# Patient Record
Sex: Female | Born: 1985 | Race: Asian | Hispanic: No | Marital: Married | Smoking: Never smoker
Health system: Southern US, Community
[De-identification: ages and names within clinical notes are randomized; demographics above are authoritative.]

## PROBLEM LIST (undated history)

## (undated) ENCOUNTER — Ambulatory Visit
Discharge status: Absent without leave | Payer: No Typology Code available for payment source | Attending: Emergency Medicine | Admitting: Emergency Medicine

---
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
## 2009-04-18 ENCOUNTER — Emergency Department (HOSPITAL_BASED_OUTPATIENT_CLINIC_OR_DEPARTMENT_OTHER): Admission: EM | Admit: 2009-04-18 | Discharge: 2009-04-19 | Payer: Self-pay | Admitting: Emergency Medicine

## 2009-05-18 ENCOUNTER — Ambulatory Visit (HOSPITAL_COMMUNITY): Admission: RE | Admit: 2009-05-18 | Discharge: 2009-05-18 | Payer: Self-pay | Admitting: Obstetrics and Gynecology

## 2009-10-10 ENCOUNTER — Inpatient Hospital Stay (HOSPITAL_COMMUNITY): Admission: AD | Admit: 2009-10-10 | Discharge: 2009-10-11 | Payer: Self-pay | Admitting: Obstetrics and Gynecology

## 2010-11-24 LAB — CBC
HCT: 35 % — ABNORMAL LOW (ref 36.0–46.0)
Hemoglobin: 8.5 g/dL — ABNORMAL LOW (ref 12.0–15.0)
MCHC: 33 g/dL (ref 30.0–36.0)
MCV: 81.2 fL (ref 78.0–100.0)
Platelets: 255 10*3/uL (ref 150–400)
RDW: 13.6 % (ref 11.5–15.5)
RDW: 13.8 % (ref 11.5–15.5)
WBC: 18.5 10*3/uL — ABNORMAL HIGH (ref 4.0–10.5)

## 2010-11-24 LAB — RPR: RPR Ser Ql: NONREACTIVE

## 2011-01-21 NOTE — Discharge Summary (Signed)
NAMERAINNA, NEARHOOD                              ACCOUNT NO.:  [id number]   MEDICAL RECORD NO.:  [id number]                   PATIENT TYPE:  INP   LOCATION:  0100                                 FACILITY:  [facility]   PHYSICIAN:  Cindie Crumbly, M.D.               DATE OF BIRTH:  [birth year]   DATE OF ADMISSION:  09/12/2002  DATE OF DISCHARGE:                                 DISCHARGE SUMMARY   REASON FOR ADMISSION:  This 25 year old Asian female was admitted  complaining of depression status post overdose as a suicide attempt.  For  further history of present illness, please see the patient's psychiatry  admission assessment.   PHYSICAL EXAMINATION:  At the time of admission was entirely unremarkable.   LABORATORY EXAMINATION:  The patient underwent a laboratory workup to rule  out any medical problems contributing to her symptomatology.  Free T4 and  TSH were within normal limits.  A urine probe for gonorrhea and chlamydia  were negative.  GGT was within normal limits.  All further laboratory was  performed at [facility] prior to the patient being transferred to  this facility for more definitive stabilization.   HOSPITAL COURSE:  On admission, the patient was psychomotor agitated,  superficial and guarded.  Her affect and mood were depressed and irritable.  She displayed poor impulse control and decreased concentration.  She refused  a trial an antidepressant medication but has been active in participating in  psychotherapy.  At the time of discharge, she denies any homicidal or  suicidal ideation.  Her affect and mood have improved.  She no longer  appears to be a danger to herself or others, and consequently is felt to  have reached her maximum benefits of hospitalization and is ready for  discharge to a less restricted alternative setting.   CONDITION ON DISCHARGE:  Improved.   DISCHARGE DIAGNOSES:   AXIS I:  1. Major depression, single episode, severe, without  psychosis.  2. Rule out adjustment disorder with mixed disturbance of conduct and     emotions.  3. Rule out conduct disorder.   AXIS II:  1. Rule out personality disorder not otherwise specified.  2. Rule out learning disorder not otherwise specified.   AXIS III:  None.   AXIS IV:  Severe   AXIS V:  Code 20 on admission, code 30 on discharge.   FURTHER EVALUATION AND TREATMENT RECOMMENDATIONS:  1. The patient is discharged to home.  2. She is discharged on an unrestricted level of activity and a regular     diet.  3. She will follow up with [facility] for all     further aspects of her psychiatric care and consequently I will sign off     on the case at this time.  She will follow up with her primary care     physician for any  medical problems.                                               Cindie Crumbly, M.D.    TS/MEDQ  D:  09/17/2002  T:  09/17/2002  Job:  [id number]

## 2011-01-21 NOTE — H&P (Signed)
[name], STAY                  ACCOUNT NO.:  [id number]   MEDICAL RECORD NO.:  [id number]          PATIENT TYPE:  INP   LOCATION:  9174                          [facility]ACILITY:  [facility]   PHYSICIAN:  Naima A. Dillard, M.D. DATE OF BIRTH:  [birth year]   DATE OF ADMISSION:  11/27/2004  DATE OF DISCHARGE:                                HISTORY & PHYSICAL   [name] is an 25 year old gravida 1, para 0, at 25 weeks by 14-6/7th weeks  ultrasound, who presented complaining of cramping tonight also reports  increased discharge over the last few days.  She denies any bleeding or  leaking and reports positive fetal movement.  She denies any recent  intercourse, trauma, or any other issues.  She denies any recent viral  syndrome, fever, or any other infectious process.   Pregnancy has been remarkable for:  1.  History of irregular cycles.  2.  Smoker.  3.  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      She had a normal Pap in November 2005.  4.  History of overdose at age 6, received counseling.  5.  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   PRENATAL LABS:  Blood type is AB positive.  Rh antibody negative.  VDRL  nonreactive.  Rubella titer positive.  Hepatitis B surface antigen negative.  Hemoglobin electrophoresis showed hemoglobin AE.  Hemoglobin upon entering  the practice was 10.3.  Pap was normal.  GC and Chlamydia cultures were  negative in the first trimester.  Cystic fibrosis testing was negative.  Quadruple screen was normal.  EDC of March 11, 2005 was established by 14-  6/7th weeks ultrasound.   HISTORY OF PRESENT PREGNANCY:  The patient entered care at approximately 12  weeks.  She had a history of irregular cycles.  She declined HIV.  Pap  records were reviewed and in December 2004, she had a low grade SIL at  [facility].  She was referred to Dr. Henderson Cloud.  She had a colposcopy with a  negative biopsy.  Pap smears were recommended every four months but  the  patient did not followup from that point.  She had another ultrasound at 18  weeks by dates but was found to be 14-6/7th weeks by ultrasound.  Quadruple  screen was done with a corrected EDC which was normal.  She had another  ultrasound at 18 to 19 weeks showing normal growth and development with an  anterior placenta.  Cervix was 4.1-cm and normal anatomy was noted.   OBSTETRICAL HISTORY:  The patient is a primigravida.   MEDICAL HISTORY:  She was on Ortho Evra patch in the past.  In December  2004, she had an abnormal Pap but then a normal biopsy.  She reports the  usual childhood illnesses.  She was hospitalized from an overdose at age 33  but denies any depression at this time.  She did have some fainting spells  this summer.   She has no known medication allergies.   FAMILY HISTORY:  Her sister had a brain tumor,  is deceased at age 32.  Her  father drinks alcohol.   GENETIC HISTORY:  Remarkable for the father, maternal grandmother, and  father's cousin being twins.  Maternal grandfather had lung cancer.  Maternal grandmother also had cancer.  Father of the baby has ADHD.   SOCIAL HISTORY:  The patient is single.  The father of the baby is involved  and supportive.  His name is Holley Bouche.  The patient is also  accompanied tonight by her boyfriend's sister, Randel Pigg.  The patient  is high school educated.  She is employed as a Actuary at [employer].  The patient's partner has a high school education.  He is  employed as an Personnel officer.  She has been followed by [facility] at [facility].  She denies any alcohol or drug use  during this pregnancy.  She has been approximately a five cigarettes per day  smoker.   PHYSICAL EXAMINATION:  VITAL SIGNS:  Stable.  The patient is afebrile.  HEENT:  Within normal limits.  LUNGS:  Breath sounds are clear.  HEART:  Regular rate and rhythm without murmur.  BREASTS:  Soft and  nontender.  ABDOMEN:  Fundal height is approximately 25-cm.  Estimated fetal weight 1 to  2 pounds.  Uterine contractions initially were every 3-4 minutes, mild to  moderate in quality, although the patient was not uncomfortable.  Fetal  heart rate was reassuring with an occasional mild variable.  PELVIC:  Speculum exam showed the cervix appearing to be about 4-cm dilated,  membranes bulging into the vault.  There was a thin vaginal discharge noted.  EXTREMITIES:  Deep tendon reflexes were 2+ without clonus.  There is a trace  edema noted.   Crist Fat was negative.  GC Chlamydia and Group B strep were done.  Wet prep was  negative.  Clean catch urine was negative.  CBC showed a hemoglobin of 10.4,  hematocrit of 31.8, white blood cell count of 18.6, and platelet count of  321.  Neutrophil percentage on differential was 75%.  Comprehensive  metabolic panel was normal.  LDH and uric acid were also normal.  Wet prep  was negative.   IMPRESSION:  1.  Intrauterine pregnancy at 25 weeks.  2.  Preterm labor.  3.  Elevated white blood cell count.   PLAN:  1.  Admit to birthing suite for consult with Dr. Pierre Bali. Dillard as      attending physician.  2.  Magnesium sulfate therapy with 4 gram bolus then 2 grams per hour.  3.  Betamethasone course 12.5 mg q.12h. x 2 doses.  4.  Penicillin for Group B strep prophylaxis, per standard dosing.  5.  Bedside ultrasound for position and cervical status.  6.  Review the status of the patient's situation with the patient including      risk of premature delivery, uncertainty of fetal status, and plan of      care regarding above interventions.  The patient seems to understand the      plan of care and wishes to proceed with it.  7.  Plan recheck CBC with differential in the morning.  8.  NICU consult and social work consult will be done.     VLL/MEDQ  D:  11/27/2004  T:  11/27/2004  Job:  [id number]

## 2011-01-21 NOTE — H&P (Signed)
NAMEALBERTINA, Wendy Oconnell                              ACCOUNT NO.:  [id number]   MEDICAL RECORD NO.:  [id number]                   PATIENT TYPE:  INP   LOCATION:  0100                                 [facility]ACILITY:  [facility]   PHYSICIAN:  Cindie Crumbly, M.D.               DATE OF BIRTH:  1985-11-28   DATE OF ADMISSION:  09/12/2002  DATE OF DISCHARGE:                         PSYCHIATRIC ADMISSION ASSESSMENT   REASON FOR ADMISSION:  This 25 year old Asian female was admitted  complaining of depression status post overdose on Ephedra as a suicide  attempt.   HISTORY OF PRESENT ILLNESS:  The patient left a suicide note.  She complains  of an increasingly depressed, irritable and angry mood most of the day  nearly every day, anhedonia, giving up on activities previously enjoyed,  decreased school performance, feelings of hopelessness, helplessness,  worthlessness, decreased concentration and energy level, increased symptoms  of fatigue, psychomotor agitated, insomnia.  She denies any change in  appetite or weight.  She admits to recurrent thoughts of death, is unable to  contract for safety.   PAST PSYCHIATRIC HISTORY:  Significant for 1 previous suicide attempt with  an ibuprofen overdose 1 year ago when she broke up with her boyfriend.  She  has no previous history of inpatient or outpatient treatment.  She reports  that she did not tell anyone the last time she overdosed.  She has a history  of conduct disorder including stealing, shoplifting, and running away.   DRUG AND ALCOHOL ABUSE HISTORY:  The patient smokes 2-3 cigarettes per day.  She denies any use of alcohol or street drugs or abuse of prescription  drugs.   PAST MEDICAL HISTORY:  The patient denies any medical or surgical problems.  She has no known drug allergies or sensitivities.   CURRENT MEDICATIONS:  She is on no current medications.   STRENGTHS AND ASSETS:  Her family is very supportive of her.   FAMILY AND SOCIAL HISTORY:   The patient lives with her mother, father and  sister.  Mother speaks [place] and does not speak [place].  The patient  does not speak [place].  Father is bilingual.  Maricela Curet is also bilingual.  The patient is currently in the 11th grade.  She works at [employer]  part time.  The patient denies any other family history of psychiatric or  neurologic illness.   MENTAL STATUS EXAM:  The patient presents as a well-developed, well-  nourished Asian female, who is alert, oriented x4, psychomotor agitated, and  whose appearance is compatible with her stated age.  Her speech is coherent  with a normal rate and volume of speech. She displays no looseness of  associations, phonemic errors or evidence of a thought disorder.  She is  superficial, guarded, gamy and manipulative.  Her affect and mood are  depressed and irritable.  Her concentration is decreased.  She displays poor  impulse control.  Her immediate recall, short term memory and remote memory  are intact.  Similarities and differences are within normal limits.  She is  able to abstract the proverbs.  Her thought processes are goal directed.   ADMISSION DIAGNOSES:   AXIS I:  1. Major depression, single episode, severe, without psychosis.  2. Rule out adjustment disorder with mixed disturbance of conduct and     emotions.   AXIS II:  1. Rule out personality disorder not otherwise specified.  2. Rule out learning disorder not otherwise specified.   AXIS III:  None.   AXIS IV:  Severe.   AXIS V:  Code 20.   FURTHER EVALUATION AND TREATMENT RECOMMENDATIONS:  1. Estimated length of stay for the patient on the inpatient unit is 5 to 7     days.  2. Initial discharge plan is to discharge the patient to home.  3. Initial plan of care is begin the patient on a trial of antidepressant     medication once informed consent is obtained and a risks/benefits     discussion has been held.  The patient however at the present time  is     refusing all trials of antidepressant medication.  Psychotherapy will     focus on improving the patient's impulse control, decreasing cognitive     distortions and potential for self harm.  A laboratory workup will also     be initiated to rule out any other medical problems contributing to her     symptomatology.                                                 Cindie Crumbly, M.D.    TS/MEDQ  D:  09/13/2002  T:  09/13/2002  Job:  [id number]

## 2011-01-21 NOTE — Discharge Summary (Signed)
NAMELASYA, Wendy Oconnell                  ACCOUNT NO.:  [id number]   MEDICAL RECORD NO.:  [id number]          PATIENT TYPE:  INP   LOCATION:  9304                          [facility]ACILITY:  [facility]   PHYSICIAN:  Janine Limbo, M.D.DATE OF BIRTH:  [birth year]   DATE OF ADMISSION:  11/27/2004  DATE OF DISCHARGE:  12/03/2004                                 DISCHARGE SUMMARY   ADMISSION DIAGNOSES:  1.  Intrauterine pregnancy at 25 weeks.  2.  Preterm labor.  3.  Elevated white blood cell count.  4.  Group B Streptococcus positive.   DISCHARGE DIAGNOSES:  1.  Intrauterine pregnancy at 25 weeks.  2.  Preterm labor.  3.  Elevated white blood cell count.  4.  Anemia.  5.  Preterm spontaneous vaginal delivery of a viable female infant weighing      1 pound 10 ounces, Apgars 6 and 8.  Cord pH 7.42.  6.  Group B Streptococcus positive.   HOSPITAL PROCEDURES:  1.  Electronic fetal monitoring.  2.  Ultrasound.  3.  Magnesium sulfate tocolysis.  4.  Spontaneous vaginal delivery.   HOSPITAL COURSE:  The patient was admitted at 25 weeks with preterm labor.  Upon admission, her cervix was 4 cm dilated, with membranes bulging into  vaginal vault.  The patient was placed on bed rest and magnesium sulfate for  tocolysis.  She was given two doses of betamethasone and given penicillin  for group B Streptococcus prophylaxis.  NICU consult was made to discuss  with the patient expectations for preterm delivery.  The patient was on bed  rest for several days.  Had some increased bleeding on November 28, 2004, but  the baby remained stable.  Her magnesium sulfate was maintained.  Her  hemoglobin dropped to 7.7 on November 29, 2004, and her white count improved  from 24.1 to 16.1.  She had an episode of bleeding on November 30, 2004 and  progressed to 4-5 cm dilated.  She was then transferred to labor and  delivery for closer monitoring.  Her white blood cell count decreased to  12.3 on December 01, 2004.  In the morning of  December 02, 2004, the patient  experienced increased pressure and bleeding and was found to be completely  dilated with the vertex presenting at +2 station.  NICU team was called.  The patient was delivered vaginally after amniotomy for clear fluid.  Female  infant was delivered with spontaneous cry.  Apgars were 6 and 8, and she had  a cord pH of 7.42, and she was taken to the NICU for care.  On postpartum  day one, the patient was doing well and requesting to go home.  Her vital  signs were stable.  She was afebrile.  Chest was clear.  Heart rate:  Regular rate and rhythm.  Abdomen was soft.  Fundus was firm.  Lochia was  small.  Hemoglobin was 8.9, and extremities were within normal limits.  She  is deemed to have received the full benefit of her hospital stay and was  discharged home.  DISCHARGE MEDICATIONS:  1.  Motrin 600 mg p.o. q.6 h. p.r.n.  2.  Micronor one p.o. daily.  3.  Vicodin 1-2 p.o. q.4 h. p.r.n. pain.  4.  Iron supplement 325 mg t.i.d.   DISCHARGE LABORATORIES:  White blood cell count 15, hemoglobin 8.9, platelet  count 325.   DISCHARGE INSTRUCTIONS:  Per [facility] handout.   DISCHARGE FOLLOWUP:  In six weeks or p.r.n.      MLW/MEDQ  D:  12/03/2004  T:  12/03/2004  Job:  [id number]

## 2011-09-06 HISTORY — PX: ECTOPIC PREGNANCY SURGERY: SHX613

## 2012-09-05 NOTE — L&D Delivery Note (Signed)
Patient was C/C/+3 and pushed for 5 minutes with epidural.   NSVD  female infant, Apgars 8,9, weight P.   The patient had one second degree midline lacerations repaired with 2-0 vicryl R. Fundus was firm. EBL was expected. Placenta was delivered intact. Vagina was clear.  Baby was vigorous and doing skin to skin with mother.  Jeraldine Primeau A

## 2012-12-18 ENCOUNTER — Other Ambulatory Visit: Payer: Self-pay | Admitting: Obstetrics and Gynecology

## 2012-12-20 LAB — OB RESULTS CONSOLE ANTIBODY SCREEN: Antibody Screen: NEGATIVE

## 2012-12-20 LAB — OB RESULTS CONSOLE ABO/RH

## 2013-01-18 LAB — OB RESULTS CONSOLE RPR
RPR: NONREACTIVE
RPR: NONREACTIVE

## 2013-01-18 LAB — OB RESULTS CONSOLE GC/CHLAMYDIA: Gonorrhea: NEGATIVE

## 2013-01-18 LAB — OB RESULTS CONSOLE HEPATITIS B SURFACE ANTIGEN: Hepatitis B Surface Ag: NEGATIVE

## 2013-01-18 LAB — OB RESULTS CONSOLE RUBELLA ANTIBODY, IGM: Rubella: IMMUNE

## 2013-07-02 LAB — OB RESULTS CONSOLE GBS: GBS: NEGATIVE

## 2013-07-21 ENCOUNTER — Inpatient Hospital Stay (HOSPITAL_COMMUNITY): Payer: 59 | Admitting: Anesthesiology

## 2013-07-21 ENCOUNTER — Encounter (HOSPITAL_COMMUNITY): Payer: Self-pay | Admitting: *Deleted

## 2013-07-21 ENCOUNTER — Inpatient Hospital Stay (HOSPITAL_COMMUNITY)
Admission: AD | Admit: 2013-07-21 | Discharge: 2013-07-23 | DRG: 775 | Disposition: A | Discharge status: Home / Self Care | Payer: 59 | Source: Ambulatory Visit | Attending: Obstetrics and Gynecology | Admitting: Obstetrics and Gynecology

## 2013-07-21 ENCOUNTER — Encounter (HOSPITAL_COMMUNITY): Payer: 59 | Admitting: Anesthesiology

## 2013-07-21 LAB — CBC
MCH: 24.9 pg — ABNORMAL LOW (ref 26.0–34.0)
MCHC: 33.9 g/dL (ref 30.0–36.0)
Platelets: 243 10*3/uL (ref 150–400)
RDW: 13.1 % (ref 11.5–15.5)

## 2013-07-21 LAB — ABO/RH: ABO/RH(D): AB POS

## 2013-07-21 LAB — RPR: RPR Ser Ql: NONREACTIVE

## 2013-07-21 LAB — POCT FERN TEST: POCT Fern Test: POSITIVE

## 2013-07-21 LAB — TYPE AND SCREEN
ABO/RH(D): AB POS
Antibody Screen: NEGATIVE

## 2013-07-21 MED ORDER — ONDANSETRON HCL 4 MG PO TABS: 4.0000 mg | ORAL_TABLET | ORAL | Status: DC | PRN

## 2013-07-21 MED ORDER — MAGNESIUM HYDROXIDE 400 MG/5ML PO SUSP: 30.0000 mL | ORAL | Status: DC | PRN

## 2013-07-21 MED ORDER — IBUPROFEN 600 MG PO TABS: 600.0000 mg | ORAL_TABLET | Freq: Four times a day (QID) | ORAL | Status: DC | PRN

## 2013-07-21 MED ORDER — METHYLERGONOVINE MALEATE 0.2 MG/ML IJ SOLN: 0.2000 mg | INTRAMUSCULAR | Status: DC | PRN

## 2013-07-21 MED ORDER — ONDANSETRON HCL 4 MG/2ML IJ SOLN: 4.0000 mg | Freq: Four times a day (QID) | INTRAMUSCULAR | Status: DC | PRN

## 2013-07-21 MED ORDER — PHENYLEPHRINE 40 MCG/ML (10ML) SYRINGE FOR IV PUSH (FOR BLOOD PRESSURE SUPPORT)
80.0000 ug | PREFILLED_SYRINGE | INTRAVENOUS | Status: DC | PRN
  Filled 2013-07-21: qty 2

## 2013-07-21 MED ORDER — METHYLERGONOVINE MALEATE 0.2 MG PO TABS: 0.2000 mg | ORAL_TABLET | ORAL | Status: DC | PRN

## 2013-07-21 MED ORDER — FLEET ENEMA 7-19 GM/118ML RE ENEM: 1.0000 | ENEMA | RECTAL | Status: DC | PRN

## 2013-07-21 MED ORDER — EPHEDRINE 5 MG/ML INJ
10.0000 mg | INTRAVENOUS | Status: DC | PRN
  Filled 2013-07-21: qty 2
  Filled 2013-07-21: qty 4

## 2013-07-21 MED ORDER — FERROUS SULFATE 325 (65 FE) MG PO TABS
325.0000 mg | ORAL_TABLET | Freq: Two times a day (BID) | ORAL | Status: DC
  Administered 2013-07-22 (×2): 325 mg via ORAL
  Filled 2013-07-21 (×2): qty 1

## 2013-07-21 MED ORDER — PRENATAL MULTIVITAMIN CH
1.0000 | ORAL_TABLET | Freq: Every day | ORAL | Status: DC
  Administered 2013-07-22: 1 via ORAL
  Filled 2013-07-21: qty 1

## 2013-07-21 MED ORDER — OXYTOCIN BOLUS FROM INFUSION
500.0000 mL | INTRAVENOUS | Status: DC
  Administered 2013-07-21: 500 mL via INTRAVENOUS

## 2013-07-21 MED ORDER — EPHEDRINE 5 MG/ML INJ
10.0000 mg | INTRAVENOUS | Status: DC | PRN
  Filled 2013-07-21: qty 2

## 2013-07-21 MED ORDER — LIDOCAINE HCL (PF) 1 % IJ SOLN
INTRAMUSCULAR | Status: DC | PRN
  Administered 2013-07-21 (×2): 5 mL
  Administered 2013-07-21: 3 mL

## 2013-07-21 MED ORDER — LANOLIN HYDROUS EX OINT: TOPICAL_OINTMENT | CUTANEOUS | Status: DC | PRN

## 2013-07-21 MED ORDER — OXYCODONE-ACETAMINOPHEN 5-325 MG PO TABS
1.0000 | ORAL_TABLET | ORAL | Status: DC | PRN
  Administered 2013-07-23: 1 via ORAL
  Filled 2013-07-21: qty 1

## 2013-07-21 MED ORDER — DIPHENHYDRAMINE HCL 50 MG/ML IJ SOLN: 12.5000 mg | INTRAMUSCULAR | Status: DC | PRN

## 2013-07-21 MED ORDER — SODIUM CHLORIDE 0.9 % IJ SOLN: 3.0000 mL | Freq: Two times a day (BID) | INTRAMUSCULAR | Status: DC

## 2013-07-21 MED ORDER — SODIUM CHLORIDE 0.9 % IJ SOLN: 3.0000 mL | INTRAMUSCULAR | Status: DC | PRN

## 2013-07-21 MED ORDER — LACTATED RINGERS IV SOLN: INTRAVENOUS | Status: DC

## 2013-07-21 MED ORDER — SIMETHICONE 80 MG PO CHEW: 80.0000 mg | CHEWABLE_TABLET | ORAL | Status: DC | PRN

## 2013-07-21 MED ORDER — MEASLES, MUMPS & RUBELLA VAC ~~LOC~~ INJ
0.5000 mL | INJECTION | Freq: Once | SUBCUTANEOUS | Status: DC
  Filled 2013-07-21: qty 0.5

## 2013-07-21 MED ORDER — FENTANYL 2.5 MCG/ML BUPIVACAINE 1/10 % EPIDURAL INFUSION (WH - ANES)
14.0000 mL/h | INTRAMUSCULAR | Status: DC | PRN
  Filled 2013-07-21: qty 125

## 2013-07-21 MED ORDER — ONDANSETRON HCL 4 MG/2ML IJ SOLN: 4.0000 mg | INTRAMUSCULAR | Status: DC | PRN

## 2013-07-21 MED ORDER — FENTANYL 2.5 MCG/ML BUPIVACAINE 1/10 % EPIDURAL INFUSION (WH - ANES)
INTRAMUSCULAR | Status: DC | PRN
  Administered 2013-07-21: 14 mL/h via EPIDURAL

## 2013-07-21 MED ORDER — ACETAMINOPHEN 325 MG PO TABS: 650.0000 mg | ORAL_TABLET | ORAL | Status: DC | PRN

## 2013-07-21 MED ORDER — LIDOCAINE HCL (PF) 1 % IJ SOLN
30.0000 mL | INTRAMUSCULAR | Status: DC | PRN
  Filled 2013-07-21 (×2): qty 30

## 2013-07-21 MED ORDER — WITCH HAZEL-GLYCERIN EX PADS
1.0000 "application " | MEDICATED_PAD | CUTANEOUS | Status: DC | PRN
  Administered 2013-07-21: 1 via TOPICAL

## 2013-07-21 MED ORDER — DIBUCAINE 1 % RE OINT: 1.0000 "application " | TOPICAL_OINTMENT | RECTAL | Status: DC | PRN

## 2013-07-21 MED ORDER — SENNOSIDES-DOCUSATE SODIUM 8.6-50 MG PO TABS
2.0000 | ORAL_TABLET | ORAL | Status: DC
  Administered 2013-07-21 – 2013-07-22 (×2): 2 via ORAL
  Filled 2013-07-21 (×2): qty 2

## 2013-07-21 MED ORDER — LACTATED RINGERS IV SOLN: 500.0000 mL | INTRAVENOUS | Status: DC | PRN

## 2013-07-21 MED ORDER — OXYCODONE-ACETAMINOPHEN 5-325 MG PO TABS: 1.0000 | ORAL_TABLET | ORAL | Status: DC | PRN

## 2013-07-21 MED ORDER — ZOLPIDEM TARTRATE 5 MG PO TABS: 5.0000 mg | ORAL_TABLET | Freq: Every evening | ORAL | Status: DC | PRN

## 2013-07-21 MED ORDER — SODIUM CHLORIDE 0.9 % IV SOLN: 250.0000 mL | INTRAVENOUS | Status: DC | PRN

## 2013-07-21 MED ORDER — DIPHENHYDRAMINE HCL 25 MG PO CAPS: 25.0000 mg | ORAL_CAPSULE | Freq: Four times a day (QID) | ORAL | Status: DC | PRN

## 2013-07-21 MED ORDER — BENZOCAINE-MENTHOL 20-0.5 % EX AERO
1.0000 "application " | INHALATION_SPRAY | CUTANEOUS | Status: DC | PRN
  Administered 2013-07-21: 1 via TOPICAL
  Filled 2013-07-21: qty 56

## 2013-07-21 MED ORDER — LACTATED RINGERS IV SOLN: 500.0000 mL | Freq: Once | INTRAVENOUS | Status: DC

## 2013-07-21 MED ORDER — CITRIC ACID-SODIUM CITRATE 334-500 MG/5ML PO SOLN: 30.0000 mL | ORAL | Status: DC | PRN

## 2013-07-21 MED ORDER — IBUPROFEN 800 MG PO TABS
800.0000 mg | ORAL_TABLET | Freq: Three times a day (TID) | ORAL | Status: DC
  Administered 2013-07-21 – 2013-07-22 (×4): 800 mg via ORAL
  Filled 2013-07-21 (×4): qty 1

## 2013-07-21 MED ORDER — OXYTOCIN 40 UNITS IN LACTATED RINGERS INFUSION - SIMPLE MED
62.5000 mL/h | INTRAVENOUS | Status: DC
  Filled 2013-07-21: qty 1000

## 2013-07-21 MED ORDER — TETANUS-DIPHTH-ACELL PERTUSSIS 5-2.5-18.5 LF-MCG/0.5 IM SUSP
0.5000 mL | Freq: Once | INTRAMUSCULAR | Status: AC
  Administered 2013-07-22: 0.5 mL via INTRAMUSCULAR
  Filled 2013-07-21: qty 0.5

## 2013-07-21 MED ORDER — PHENYLEPHRINE 40 MCG/ML (10ML) SYRINGE FOR IV PUSH (FOR BLOOD PRESSURE SUPPORT)
80.0000 ug | PREFILLED_SYRINGE | INTRAVENOUS | Status: DC | PRN
  Filled 2013-07-21: qty 2
  Filled 2013-07-21 (×2): qty 10

## 2013-07-21 NOTE — H&P (Signed)
27 y.o. [redacted]w[redacted]d  [id number] comes in c/o SROM at 14:00 today.  Otherwise has good fetal movement and no bleeding.  History reviewed. No pertinent past medical history.  Past Surgical History  Procedure Laterality Date  . Ectopic pregnancy surgery Left 2013    OB History  Gravida Para Term Preterm AB SAB TAB Ectopic Multiple Living  4 2 1 1 1   1  2     # Outcome Date GA Lbr Len/2nd Weight Sex Delivery Anes PTL Lv  4 CUR           3 ECT 03/20/12          2 TRM 10/10/09    F SVD   Y  1 PRE 12/02/04    F SVD  Y Y      History   Social History  . Marital Status: Married    Spouse Name: N/A    Number of Children: N/A  . Years of Education: N/A   Occupational History  . Not on file.   Social History Main Topics  . Smoking status: Never Smoker   . Smokeless tobacco: Never Used  . Alcohol Use: No  . Drug Use: No  . Sexual Activity: Yes    Birth Control/ Protection: None   Other Topics Concern  . Not on file   Social History Narrative  . No narrative on file   Review of patient's allergies indicates no known allergies.    Prenatal Transfer Tool  Maternal Diabetes: No- passed 3 hour gtt Genetic Screening: Normal Maternal Ultrasounds/Referrals: Normal Fetal Ultrasounds or other Referrals:  None Maternal Substance Abuse:  No Significant Maternal Medications:  Meds include: Progesterone Significant Maternal Lab Results: None  Other PNC: uncomplicated.    Filed Vitals:   07/21/13 1614  BP: 120/64  Pulse: 77  Temp: 98.1 F (36.7 C)  Resp: 18     Lungs/Cor:  NAD Abdomen:  soft, gravid Ex:  no cords, erythema SVE:  4/C/-2, clear fluid FHTs:  120s, good STV, NST R Toco:  q3-8   A/P   Term ROM- pitocin for augmentation if no change in 2 hours.  GBS neg.  Tommi Crepeau A

## 2013-07-21 NOTE — Progress Notes (Signed)
Dr Henderson Cloud notified of pt's ROM, VE, orders received to admit pt.

## 2013-07-21 NOTE — Anesthesia Preprocedure Evaluation (Signed)

## 2013-07-21 NOTE — MAU Note (Signed)
Pt presents with complaints of  A large gush of fluid around 2o'clock today. Pt denies any vaginal bleeding or contractions

## 2013-07-21 NOTE — Anesthesia Procedure Notes (Signed)
Epidural Patient location during procedure: OB  Staffing Anesthesiologist: Lockie Bothun Performed by: anesthesiologist   Preanesthetic Checklist Completed: patient identified, site marked, surgical consent, pre-op evaluation, timeout performed, IV checked, risks and benefits discussed and monitors and equipment checked  Epidural Patient position: sitting Prep: ChloraPrep Patient monitoring: heart rate, continuous pulse ox and blood pressure Approach: right paramedian Injection technique: LOR saline  Needle:  Needle type: Tuohy  Needle gauge: 17 G Needle length: 9 cm and 9 Needle insertion depth: 5 cm Catheter type: closed end flexible Catheter size: 20 Guage Catheter at skin depth: 10 cm Test dose: negative  Assessment Events: blood not aspirated, injection not painful, no injection resistance, negative IV test and no paresthesia  Additional Notes   Patient tolerated the insertion well without complications.   

## 2013-07-22 LAB — CBC
HCT: 27 % — ABNORMAL LOW (ref 36.0–46.0)
Hemoglobin: 9.2 g/dL — ABNORMAL LOW (ref 12.0–15.0)
MCHC: 34.1 g/dL (ref 30.0–36.0)
MCV: 74 fL — ABNORMAL LOW (ref 78.0–100.0)
RBC: 3.65 MIL/uL — ABNORMAL LOW (ref 3.87–5.11)
RDW: 13.1 % (ref 11.5–15.5)
WBC: 16.2 10*3/uL — ABNORMAL HIGH (ref 4.0–10.5)

## 2013-07-22 NOTE — Progress Notes (Signed)
Post Partum Day 1 Subjective: no complaints  Objective: Blood pressure 99/63, pulse 83, temperature 97.9 F (36.6 C), temperature source Oral, resp. rate 20, height 5\' 2"  (1.575 m), weight 64.411 kg (142 lb), last menstrual period 10/25/2012, SpO2 100.00%,  breastfeeding.  Physical Exam:  General: alert Lochia: appropriate Uterine Fundus: firm   Recent Labs  07/21/13 1550 07/22/13 0555  HGB 10.2* 9.2*  HCT 30.1* 27.0*    Assessment/Plan: Plan for discharge tomorrow.  Baby not ready for circ this morning because he has not voided yet.  I will try to perform later today.   LOS: 1 day   Fountain Derusha D 07/22/2013, 10:30 AM

## 2013-07-22 NOTE — Lactation Note (Signed)
This note was copied from the chart of Wendy Oconnell. Lactation Consultation Note Initial consultation, mom's 3rd child, mom attempted to breast feed the other 2 but quit when she felt exhausted and nipples were very sore. Mom supplemented early and often and in turn had low milk supply. At this time, mom seems insecure about her ability to breast feed. Mom reports that she wants to try to breast feed this time, but states that it is exhausting. She has breast fed the baby 3 times already, baby is now 43 hours old.  Discussed br feeding basics, reviewed baby and me book. Discussed in detail the importance of good position and latch, and frequent STS and cue based breast feeding, to protect the nipples and produce more milk. Urged mom to delay introducing formula, bottle, or paci (unless medically necessary) for at least several weeks. Demonstrated good position and discussed at length how a good latch will protect her nipples from becoming sore.  At this time, baby asleep and not showing hunger cues, but had not eaten in 4 hours. Offered to assist, mom accepts. Attempted to wake baby, demonstrated to mom how to get baby in cross cradle (her preferred position), mom is able to hand express very large amount of colostrum; baby latched for a moment, then was asleep again.  Reviewed lactation brochure, community resources and BFSG. Enc mom to call for help if needed.   Patient Name: Wendy Oconnell [id number] Date: 07/22/2013 Reason for consult: Initial assessment   Maternal Data Formula Feeding for Exclusion: Yes Reason for exclusion: Mother's choice to formula and breast feed on admission Infant to breast within first hour of birth: Yes Has patient been taught Hand Expression?: Yes Does the patient have breastfeeding experience prior to this delivery?: Yes  Feeding Feeding Type: Breast Fed  LATCH Score/Interventions Latch: Grasps breast easily, tongue down, lips flanged, rhythmical  sucking.  Audible Swallowing: A few with stimulation Intervention(s): Skin to skin  Type of Nipple: Everted at rest and after stimulation  Comfort (Breast/Nipple): Soft / non-tender     Hold (Positioning): Assistance needed to correctly position infant at breast and maintain latch.  LATCH Score: 8  Lactation Tools Discussed/Used     Consult Status Consult Status: Follow-up Follow-up type: In-patient    Octavio Manns [facility] 07/22/2013, 2:13 PM

## 2013-07-22 NOTE — Anesthesia Postprocedure Evaluation (Signed)
  Anesthesia Post-op Note  Patient: Wendy Oconnell  Procedure(s) Performed: * No procedures listed *  Patient Location: Mother/Baby  Anesthesia Type:Epidural  Level of Consciousness: awake, alert , oriented and patient cooperative  Airway and Oxygen Therapy: Patient Spontanous Breathing  Post-op Pain: mild  Post-op Assessment: Patient's Cardiovascular Status Stable, Respiratory Function Stable, Patent Airway, No signs of Nausea or vomiting, Adequate PO intake and Pain level controlled  Post-op Vital Signs: stable  Complications: No apparent anesthesia complications

## 2013-07-23 MED ORDER — OXYCODONE-ACETAMINOPHEN 5-325 MG PO TABS: 2.0000 | ORAL_TABLET | ORAL | Status: DC | PRN

## 2013-07-23 MED ORDER — DOCUSATE SODIUM 100 MG PO CAPS: 100.0000 mg | ORAL_CAPSULE | Freq: Two times a day (BID) | ORAL | Status: DC

## 2013-07-23 MED ORDER — IBUPROFEN 600 MG PO TABS: 600.0000 mg | ORAL_TABLET | Freq: Four times a day (QID) | ORAL | Status: DC | PRN

## 2013-07-23 NOTE — Discharge Summary (Signed)
Obstetric Discharge Summary Reason for Admission: rupture of membranes Prenatal Procedures: ultrasound Intrapartum Procedures: spontaneous vaginal delivery Postpartum Procedures: none Complications-Operative and Postpartum: 2nd degree perineal laceration Hemoglobin  Date Value Range Status  07/22/2013 9.2* 12.0 - 15.0 g/dL Final     HCT  Date Value Range Status  07/22/2013 27.0* 36.0 - 46.0 % Final    Physical Exam:  General: alert, cooperative and appears stated age 27: appropriate Uterine Fundus: firm  Discharge Diagnoses: Term Pregnancy-delivered  Discharge Information: Date: 07/23/2013 Activity: pelvic rest Diet: routine Medications: Ibuprofen, Colace and Percocet Condition: improved Instructions: refer to practice specific booklet Discharge to: home Follow-up Information   Follow up with HORVATH,MICHELLE A, MD In 4 weeks. (For a postpartum evaluation)    Specialty:  Obstetrics and Gynecology   Contact information:   [address] [place] [address] [phone]       Newborn Data: Live born female  Birth Weight: 6 lb 7.9 oz (2945 g) APGAR: 8, 9  Home with mother.  Caitlynn Ju H. 07/23/2013, 8:34 AM

## 2013-08-01 ENCOUNTER — Inpatient Hospital Stay (HOSPITAL_COMMUNITY): Admission: AD | Admit: 2013-08-01 | Payer: Self-pay | Source: Ambulatory Visit | Admitting: Obstetrics and Gynecology

## 2014-07-07 ENCOUNTER — Encounter (HOSPITAL_COMMUNITY): Payer: Self-pay | Admitting: *Deleted

## 2017-10-21 ENCOUNTER — Other Ambulatory Visit: Payer: Self-pay

## 2017-10-21 ENCOUNTER — Emergency Department (HOSPITAL_COMMUNITY)
Admission: EM | Admit: 2017-10-21 | Discharge: 2017-10-21 | Disposition: A | Discharge status: Home / Self Care | Payer: No Typology Code available for payment source | Attending: Emergency Medicine | Admitting: Emergency Medicine

## 2017-10-21 ENCOUNTER — Emergency Department (HOSPITAL_COMMUNITY): Payer: No Typology Code available for payment source

## 2017-10-21 ENCOUNTER — Encounter (HOSPITAL_COMMUNITY): Payer: Self-pay | Admitting: Emergency Medicine

## 2017-10-21 DIAGNOSIS — Y92410 Unspecified street and highway as the place of occurrence of the external cause: Secondary | ICD-10-CM | POA: Diagnosis not present

## 2017-10-21 DIAGNOSIS — Y9389 Activity, other specified: Secondary | ICD-10-CM | POA: Diagnosis not present

## 2017-10-21 DIAGNOSIS — M25511 Pain in right shoulder: Secondary | ICD-10-CM | POA: Diagnosis present

## 2017-10-21 DIAGNOSIS — S20211A Contusion of right front wall of thorax, initial encounter: Secondary | ICD-10-CM | POA: Diagnosis not present

## 2017-10-21 DIAGNOSIS — Z79899 Other long term (current) drug therapy: Secondary | ICD-10-CM | POA: Diagnosis not present

## 2017-10-21 DIAGNOSIS — Y999 Unspecified external cause status: Secondary | ICD-10-CM | POA: Diagnosis not present

## 2017-10-21 LAB — POC URINE PREG, ED: Preg Test, Ur: NEGATIVE

## 2017-10-21 MED ORDER — DICLOFENAC SODIUM 50 MG PO TBEC: 50.0000 mg | DELAYED_RELEASE_TABLET | Freq: Two times a day (BID) | ORAL | 0 refills | Status: DC

## 2017-10-21 MED ORDER — CYCLOBENZAPRINE HCL 5 MG PO TABS: 5.0000 mg | ORAL_TABLET | Freq: Two times a day (BID) | ORAL | 0 refills | Status: DC | PRN
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
## 2017-10-21 NOTE — ED Notes (Signed)
Bed: WTR6 Expected date:  Expected time:  Means of arrival:  Comments: 

## 2017-10-21 NOTE — Discharge Instructions (Signed)
Do not drive while taking the muscle relaxer as it can make you sleepy. Follow up with your doctor. Return here for worsening symptoms.
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
## 2020-08-31 ENCOUNTER — Ambulatory Visit
Admission: RE | Admit: 2020-08-31 | Discharge: 2020-08-31 | Disposition: A | Discharge status: Home / Self Care | Payer: BLUE CROSS/BLUE SHIELD | Source: Ambulatory Visit | Attending: Emergency Medicine | Admitting: Emergency Medicine

## 2020-08-31 ENCOUNTER — Other Ambulatory Visit: Payer: Self-pay

## 2020-08-31 VITALS — BP 130/92 | HR 98 | Temp 99.0°F | Resp 20

## 2020-08-31 DIAGNOSIS — J069 Acute upper respiratory infection, unspecified: Secondary | ICD-10-CM | POA: Diagnosis not present

## 2020-08-31 DIAGNOSIS — J029 Acute pharyngitis, unspecified: Secondary | ICD-10-CM | POA: Diagnosis not present

## 2020-08-31 LAB — POCT RAPID STREP A (OFFICE): Rapid Strep A Screen: NEGATIVE

## 2020-08-31 MED ORDER — FLUTICASONE PROPIONATE 50 MCG/ACT NA SUSP: 1.0000 | Freq: Every day | NASAL | 0 refills | Status: DC

## 2020-08-31 MED ORDER — BENZONATATE 100 MG PO CAPS: 100.0000 mg | ORAL_CAPSULE | Freq: Three times a day (TID) | ORAL | 0 refills | Status: DC

## 2020-08-31 MED ORDER — CETIRIZINE HCL 10 MG PO TABS: 10.0000 mg | ORAL_TABLET | Freq: Every day | ORAL | 0 refills | Status: DC

## 2020-08-31 NOTE — ED Triage Notes (Signed)
Patient states she has had a sore throat and congestion and body aches since christmas. Pt also states she had a fever yesterday of 100.7. Pt is aox4 and ambulatory.

## 2020-08-31 NOTE — Discharge Instructions (Addendum)

## 2020-08-31 NOTE — ED Provider Notes (Signed)
[facility]    CSN: [id number] Arrival date & time: 08/31/20  1155      History   Chief Complaint Chief Complaint  Patient presents with  . Generalized Body Aches    X 3 days  . Sore Throat    X 3 days  . Fever    Yesterday 100.7  . Nasal Congestion    X 2 days    HPI Robin Petrakis is a 34 y.o. female  History was provided by the patient. Salene Mohamud is a 34 y.o. female who presents for evaluation of a sore throat. Associated symptoms include nasal blockage, post nasal drip, sinus and nasal congestion, sore throat and myalgias. Onset of symptoms was a few days ago, stable since that time.  She is drinking plenty of fluids. She has not had recent close exposure to someone with proven streptococcal pharyngitis. The following portions of the patient's history were reviewed and updated as appropriate: allergies, current medications, past family history, past medical history, past social history, past surgical history and problem list.     History reviewed. No pertinent past medical history.  There are no problems to display for this patient.   Past Surgical History:  Procedure Laterality Date  . ECTOPIC PREGNANCY SURGERY Left 2013    OB History    Gravida  4   Para  3   Term  2   Preterm  1   AB  1   Living  3     SAB      IAB      Ectopic  1   Multiple      Live Births  3            Home Medications    Prior to Admission medications   Medication Sig Start Date End Date Taking? Authorizing Provider  benzonatate (TESSALON) 100 MG capsule Take 1 capsule (100 mg total) by mouth every 8 (eight) hours. 08/31/20  Yes Hall-Potvin, [place], PA-C  cetirizine (ZYRTEC ALLERGY) 10 MG tablet Take 1 tablet (10 mg total) by mouth daily. 08/31/20  Yes Hall-Potvin, [place], PA-C  fluticasone (FLONASE) 50 MCG/ACT nasal spray Place 1 spray into both nostrils daily. 08/31/20  Yes Hall-Potvin, [place], PA-C    Family History Family History   Problem Relation Age of Onset  . Hypertension Father     Social History Social History   Tobacco Use  . Smoking status: Never Smoker  . Smokeless tobacco: Never Used  Vaping Use  . Vaping Use: Never used  Substance Use Topics  . Alcohol use: No  . Drug use: No     Allergies   Patient has no known allergies.   Review of Systems Review of Systems  Constitutional: Negative for fatigue and fever.  HENT: Positive for congestion and sore throat. Negative for dental problem, ear pain, facial swelling, hearing loss, sinus pain, trouble swallowing and voice change.   Eyes: Negative for photophobia, pain and visual disturbance.  Respiratory: Positive for cough. Negative for shortness of breath and wheezing.   Cardiovascular: Negative for chest pain and palpitations.  Gastrointestinal: Negative for diarrhea and vomiting.  Musculoskeletal: Positive for myalgias. Negative for arthralgias.  Neurological: Negative for dizziness and headaches.     Physical Exam Triage Vital Signs ED Triage Vitals [08/31/20 1222]  Enc Vitals Group     BP (!) 130/92     Pulse Rate 98     Resp 20     Temp 99 F (37.2 C)  Temp Source Oral     SpO2 98 %     Weight      Height      Head Circumference      Peak Flow      Pain Score      Pain Loc      Pain Edu?      Excl. in GC?    No data found.  Updated Vital Signs BP (!) 130/92 (BP Location: Right Arm)   Pulse 98   Temp 99 F (37.2 C) (Oral)   Resp 20   LMP 08/14/2020 (Approximate)   SpO2 98%   Visual Acuity Right Eye Distance:   Left Eye Distance:   Bilateral Distance:    Right Eye Near:   Left Eye Near:    Bilateral Near:     Physical Exam Constitutional:      General: She is not in acute distress.    Appearance: She is not ill-appearing or diaphoretic.  HENT:     Head: Normocephalic and atraumatic.     Right Ear: Tympanic membrane and ear canal normal.     Left Ear: Tympanic membrane and ear canal normal.      Mouth/Throat:     Mouth: Mucous membranes are moist.     Pharynx: Oropharynx is clear. Uvula midline. No oropharyngeal exudate or uvula swelling.     Tonsils: No tonsillar exudate. 1+ on the right. 1+ on the left.     Comments: Cobblestoning present Eyes:     General: No scleral icterus.    Conjunctiva/sclera: Conjunctivae normal.     Pupils: Pupils are equal, round, and reactive to light.  Neck:     Comments: Trachea midline, negative JVD Cardiovascular:     Rate and Rhythm: Normal rate and regular rhythm.     Heart sounds: No murmur heard. No gallop.   Pulmonary:     Effort: Pulmonary effort is normal. No respiratory distress.     Breath sounds: No wheezing, rhonchi or rales.  Musculoskeletal:     Cervical back: Neck supple. No tenderness.  Lymphadenopathy:     Cervical: Cervical adenopathy present.  Skin:    Capillary Refill: Capillary refill takes less than 2 seconds.     Coloration: Skin is not jaundiced or pale.     Findings: No rash.  Neurological:     General: No focal deficit present.     Mental Status: She is alert and oriented to person, place, and time.      UC Treatments / Results  Labs (all labs ordered are listed, but only abnormal results are displayed) Labs Reviewed  NOVEL CORONAVIRUS, NAA  CULTURE, GROUP A STREP [facility])  POCT RAPID STREP A (OFFICE)    EKG   Radiology No results found.  Procedures Procedures (including critical care time)  Medications Ordered in UC Medications - No data to display  Initial Impression / Assessment and Plan / UC Course  I have reviewed the triage vital signs and the nursing notes.  Pertinent labs & imaging results that were available during my care of the patient were reviewed by me and considered in my medical decision making (see chart for details).     Patient afebrile, nontoxic, with SpO2 98%.  Rapid strep negative, culture pending.  Covid PCR pending.  Patient to quarantine until results are back.  We  will treat supportively as outlined below.  Return precautions discussed, patient verbalized understanding and is agreeable to plan. Final Clinical Impressions(s) / UC Diagnoses  Final diagnoses:  Sore throat  URI with cough and congestion     Discharge Instructions     Tessalon for cough. Start flonase, atrovent nasal spray for nasal congestion/drainage. You can use over the counter nasal saline rinse such as neti pot for nasal congestion. Keep hydrated, your urine should be clear to pale yellow in color. Tylenol/motrin for fever and pain. Monitor for any worsening of symptoms, chest pain, shortness of breath, wheezing, swelling of the throat, go to the emergency department for further evaluation needed.     ED Prescriptions    Medication Sig Dispense Auth. Provider   cetirizine (ZYRTEC ALLERGY) 10 MG tablet Take 1 tablet (10 mg total) by mouth daily. 30 tablet Hall-Potvin, [place], PA-C   fluticasone (FLONASE) 50 MCG/ACT nasal spray Place 1 spray into both nostrils daily. 16 g Hall-Potvin, [place], PA-C   benzonatate (TESSALON) 100 MG capsule Take 1 capsule (100 mg total) by mouth every 8 (eight) hours. 21 capsule Hall-Potvin, [place], PA-C     PDMP not reviewed this encounter.   Hall-Potvin, [place], [place] 08/31/20 1303

## 2020-09-01 ENCOUNTER — Telehealth: Payer: Self-pay | Admitting: Emergency Medicine

## 2020-09-01 DIAGNOSIS — J029 Acute pharyngitis, unspecified: Secondary | ICD-10-CM

## 2020-09-01 NOTE — Telephone Encounter (Signed)
Received call from lab, error with strep culture.  Order placed.  Confirm lab as specimen.

## 2020-09-01 NOTE — Telephone Encounter (Signed)
error 

## 2020-09-02 LAB — SARS-COV-2, NAA 2 DAY TAT

## 2020-09-02 LAB — NOVEL CORONAVIRUS, NAA: SARS-CoV-2, NAA: DETECTED — AB

## 2020-09-03 LAB — CULTURE, GROUP A STREP (THRC)

## 2021-07-15 ENCOUNTER — Ambulatory Visit: Payer: BLUE CROSS/BLUE SHIELD

## 2021-07-18 ENCOUNTER — Ambulatory Visit
Admission: RE | Admit: 2021-07-18 | Discharge: 2021-07-18 | Disposition: A | Discharge status: Home / Self Care | Payer: 59 | Source: Ambulatory Visit | Attending: Physician Assistant | Admitting: Physician Assistant

## 2021-07-18 ENCOUNTER — Other Ambulatory Visit: Payer: Self-pay

## 2021-07-18 VITALS — BP 96/69 | HR 122 | Temp 99.4°F | Resp 18

## 2021-07-18 DIAGNOSIS — R509 Fever, unspecified: Secondary | ICD-10-CM | POA: Diagnosis not present

## 2021-07-18 DIAGNOSIS — M791 Myalgia, unspecified site: Secondary | ICD-10-CM | POA: Diagnosis not present

## 2021-07-18 DIAGNOSIS — R112 Nausea with vomiting, unspecified: Secondary | ICD-10-CM | POA: Diagnosis not present

## 2021-07-18 DIAGNOSIS — R52 Pain, unspecified: Secondary | ICD-10-CM

## 2021-07-18 LAB — POCT INFLUENZA A/B
Influenza A, POC: NEGATIVE
Influenza B, POC: NEGATIVE

## 2021-07-18 NOTE — ED Provider Notes (Signed)
[facility]    CSN: [id number] Arrival date & time: 07/18/21  1309      History   Chief Complaint Chief Complaint  Patient presents with   Fever   Generalized Body Aches   Otalgia   not taking solid foods    HPI Wendy Oconnell is a 35 y.o. female.   Patient here today for evaluation of diffuse body aches that started 6 days ago.  She reports that symptoms seem to be worsening instead of improving.  She has not been able to work for the last several days due to pain.  She is also not able to eat due to nausea and some vomiting.  She reports some headache as well.  She has had a fever with temperatures between 100-101F.  She has tried multiple over-the-counter medications and pain relievers without significant relief.  The history is provided by the patient.  Fever Associated symptoms: myalgias, nausea and vomiting   Associated symptoms: no congestion, no cough, no diarrhea and no sore throat   Otalgia Associated symptoms: fever and vomiting   Associated symptoms: no congestion, no cough, no diarrhea and no sore throat    History reviewed. No pertinent past medical history.  There are no problems to display for this patient.   Past Surgical History:  Procedure Laterality Date   ECTOPIC PREGNANCY SURGERY Left 2013    OB History     Gravida  4   Para  3   Term  2   Preterm  1   AB  1   Living  3      SAB      IAB      Ectopic  1   Multiple      Live Births  3            Home Medications    Prior to Admission medications   Medication Sig Start Date End Date Taking? Authorizing Provider  benzonatate (TESSALON) 100 MG capsule Take 1 capsule (100 mg total) by mouth every 8 (eight) hours. 08/31/20   Hall-Potvin, [place], PA-C  cetirizine (ZYRTEC ALLERGY) 10 MG tablet Take 1 tablet (10 mg total) by mouth daily. 08/31/20   Hall-Potvin, [place], PA-C  fluticasone (FLONASE) 50 MCG/ACT nasal spray Place 1 spray into both nostrils daily.  08/31/20   Hall-Potvin, [place], PA-C    Family History Family History  Problem Relation Age of Onset   Hypertension Father     Social History Social History   Tobacco Use   Smoking status: Never   Smokeless tobacco: Never  Vaping Use   Vaping Use: Never used  Substance Use Topics   Alcohol use: No   Drug use: No     Allergies   Patient has no known allergies.   Review of Systems Review of Systems  Constitutional:  Positive for appetite change and fever.  HENT:  Negative for congestion and sore throat.   Eyes:  Negative for redness.  Respiratory:  Negative for cough and shortness of breath.   Gastrointestinal:  Positive for nausea and vomiting. Negative for diarrhea.  Musculoskeletal:  Positive for myalgias.    Physical Exam Triage Vital Signs ED Triage Vitals  Enc Vitals Group     BP 07/18/21 1331 96/69     Pulse Rate 07/18/21 1331 (!) 136     Resp 07/18/21 1331 18     Temp 07/18/21 1331 99.4 F (37.4 C)     Temp src --  SpO2 07/18/21 1331 97 %     Weight --      Height --      Head Circumference --      Peak Flow --      Pain Score 07/18/21 1333 10     Pain Loc --      Pain Edu? --      Excl. in GC? --    No data found.  Updated Vital Signs BP 96/69   Pulse (!) 122   Temp 99.4 F (37.4 C)   Resp 18   LMP 07/08/2021   SpO2 97%      Physical Exam Vitals and nursing note reviewed.  Constitutional:      General: She is not in acute distress.    Appearance: Normal appearance. She is not ill-appearing.  HENT:     Head: Normocephalic and atraumatic.     Nose: No congestion or rhinorrhea.     Mouth/Throat:     Mouth: Mucous membranes are moist.     Pharynx: No oropharyngeal exudate or posterior oropharyngeal erythema.  Eyes:     Conjunctiva/sclera: Conjunctivae normal.  Cardiovascular:     Rate and Rhythm: Normal rate and regular rhythm.     Heart sounds: Normal heart sounds. No murmur heard. Pulmonary:     Effort: Pulmonary effort  is normal. No respiratory distress.     Breath sounds: Normal breath sounds. No wheezing, rhonchi or rales.  Skin:    General: Skin is warm and dry.  Neurological:     Mental Status: She is alert.  Psychiatric:        Mood and Affect: Mood normal.        Thought Content: Thought content normal.     UC Treatments / Results  Labs (all labs ordered are listed, but only abnormal results are displayed) Labs Reviewed  NOVEL CORONAVIRUS, NAA  POCT INFLUENZA A/B    EKG   Radiology No results found.  Procedures Procedures (including critical care time)  Medications Ordered in UC Medications - No data to display  Initial Impression / Assessment and Plan / UC Course  I have reviewed the triage vital signs and the nursing notes.  Pertinent labs & imaging results that were available during my care of the patient were reviewed by me and considered in my medical decision making (see chart for details).  Flu test negative in office.  Will order COVID screening.  Unknown etiology of symptoms as this is a very unusual presentation but could be related to viral illness.  I did recommend follow-up with her PCP as she may need further work-up.  Patient expresses understanding.  Final Clinical Impressions(s) / UC Diagnoses   Final diagnoses:  Body aches     Discharge Instructions      Please report to ED with any worsening symptoms.      ED Prescriptions   None    PDMP not reviewed this encounter.   Tomi Bamberger, PA-C 07/18/21 [phone]

## 2021-07-18 NOTE — Discharge Instructions (Addendum)
  Please report to ED with any worsening symptoms. 

## 2021-07-18 NOTE — ED Triage Notes (Signed)
Pt reports Sx' started last week on Monday and have worsen. Pt has not been able to eat solids but has been keeping down liquids. Pt reports general body aches ,ear pain on Lt. Fever and HA.

## 2021-07-19 ENCOUNTER — Other Ambulatory Visit: Payer: Self-pay

## 2021-07-19 ENCOUNTER — Encounter (HOSPITAL_COMMUNITY): Payer: Self-pay | Admitting: Emergency Medicine

## 2021-07-19 ENCOUNTER — Emergency Department (HOSPITAL_COMMUNITY): Payer: 59

## 2021-07-19 ENCOUNTER — Inpatient Hospital Stay (HOSPITAL_COMMUNITY)
Admission: EM | Admit: 2021-07-19 | Discharge: 2021-07-22 | DRG: 872 | Disposition: A | Discharge status: Home / Self Care | Payer: 59 | Attending: Internal Medicine | Admitting: Internal Medicine

## 2021-07-19 DIAGNOSIS — R109 Unspecified abdominal pain: Secondary | ICD-10-CM

## 2021-07-19 DIAGNOSIS — R630 Anorexia: Secondary | ICD-10-CM | POA: Diagnosis present

## 2021-07-19 DIAGNOSIS — Z23 Encounter for immunization: Secondary | ICD-10-CM

## 2021-07-19 DIAGNOSIS — A419 Sepsis, unspecified organism: Secondary | ICD-10-CM | POA: Diagnosis not present

## 2021-07-19 DIAGNOSIS — E871 Hypo-osmolality and hyponatremia: Secondary | ICD-10-CM | POA: Diagnosis present

## 2021-07-19 DIAGNOSIS — E860 Dehydration: Secondary | ICD-10-CM | POA: Diagnosis present

## 2021-07-19 DIAGNOSIS — R102 Pelvic and perineal pain: Secondary | ICD-10-CM

## 2021-07-19 DIAGNOSIS — R809 Proteinuria, unspecified: Secondary | ICD-10-CM | POA: Diagnosis present

## 2021-07-19 DIAGNOSIS — Z6825 Body mass index (BMI) 25.0-25.9, adult: Secondary | ICD-10-CM

## 2021-07-19 DIAGNOSIS — Z79899 Other long term (current) drug therapy: Secondary | ICD-10-CM

## 2021-07-19 DIAGNOSIS — Z20822 Contact with and (suspected) exposure to covid-19: Secondary | ICD-10-CM | POA: Diagnosis present

## 2021-07-19 DIAGNOSIS — E876 Hypokalemia: Secondary | ICD-10-CM | POA: Diagnosis present

## 2021-07-19 DIAGNOSIS — N179 Acute kidney failure, unspecified: Secondary | ICD-10-CM | POA: Diagnosis present

## 2021-07-19 DIAGNOSIS — F909 Attention-deficit hyperactivity disorder, unspecified type: Secondary | ICD-10-CM | POA: Diagnosis present

## 2021-07-19 DIAGNOSIS — N12 Tubulo-interstitial nephritis, not specified as acute or chronic: Secondary | ICD-10-CM | POA: Diagnosis present

## 2021-07-19 DIAGNOSIS — Z8249 Family history of ischemic heart disease and other diseases of the circulatory system: Secondary | ICD-10-CM

## 2021-07-19 DIAGNOSIS — R6520 Severe sepsis without septic shock: Secondary | ICD-10-CM | POA: Diagnosis present

## 2021-07-19 DIAGNOSIS — D509 Iron deficiency anemia, unspecified: Secondary | ICD-10-CM | POA: Diagnosis present

## 2021-07-19 LAB — COMPREHENSIVE METABOLIC PANEL
ALT: 16 U/L (ref 0–44)
AST: 25 U/L (ref 15–41)
Albumin: 3 g/dL — ABNORMAL LOW (ref 3.5–5.0)
Alkaline Phosphatase: 94 U/L (ref 38–126)
Anion gap: 12 (ref 5–15)
BUN: 34 mg/dL — ABNORMAL HIGH (ref 6–20)
CO2: 19 mmol/L — ABNORMAL LOW (ref 22–32)
Calcium: 8.5 mg/dL — ABNORMAL LOW (ref 8.9–10.3)
Chloride: 96 mmol/L — ABNORMAL LOW (ref 98–111)
Creatinine, Ser: 3.95 mg/dL — ABNORMAL HIGH (ref 0.44–1.00)
GFR, Estimated: 14 mL/min — ABNORMAL LOW (ref >60)
Glucose, Bld: 149 mg/dL — ABNORMAL HIGH (ref 70–99)
Potassium: 2.5 mmol/L — CL (ref 3.5–5.1)
Sodium: 127 mmol/L — ABNORMAL LOW (ref 135–145)
Total Bilirubin: 1.1 mg/dL (ref 0.3–1.2)
Total Protein: 7.6 g/dL (ref 6.5–8.1)

## 2021-07-19 LAB — URINALYSIS, ROUTINE W REFLEX MICROSCOPIC
Glucose, UA: 100 mg/dL — AB
Ketones, ur: NEGATIVE mg/dL
Nitrite: NEGATIVE
Protein, ur: >300 mg/dL — AB
Specific Gravity, Urine: 1.025 (ref 1.005–1.030)
pH: 5 (ref 5.0–8.0)

## 2021-07-19 LAB — I-STAT BETA HCG BLOOD, ED (MC, WL, AP ONLY): I-stat hCG, quantitative: 53.8 m[IU]/mL — ABNORMAL HIGH (ref <5)

## 2021-07-19 LAB — CBC WITH DIFFERENTIAL/PLATELET
Abs Immature Granulocytes: 0 10*3/uL (ref 0.00–0.07)
Basophils Absolute: 0.3 10*3/uL — ABNORMAL HIGH (ref 0.0–0.1)
Basophils Relative: 1 %
Eosinophils Absolute: 0 10*3/uL (ref 0.0–0.5)
Eosinophils Relative: 0 %
HCT: 31.4 % — ABNORMAL LOW (ref 36.0–46.0)
Hemoglobin: 10.4 g/dL — ABNORMAL LOW (ref 12.0–15.0)
Lymphocytes Relative: 8 %
Lymphs Abs: 2.5 10*3/uL (ref 0.7–4.0)
MCH: 24.1 pg — ABNORMAL LOW (ref 26.0–34.0)
MCHC: 33.1 g/dL (ref 30.0–36.0)
MCV: 72.9 fL — ABNORMAL LOW (ref 80.0–100.0)
Monocytes Absolute: 1.3 10*3/uL — ABNORMAL HIGH (ref 0.1–1.0)
Monocytes Relative: 4 %
Neutro Abs: 27.7 10*3/uL — ABNORMAL HIGH (ref 1.7–7.7)
Neutrophils Relative %: 87 %
Platelets: 299 10*3/uL (ref 150–400)
RBC: 4.31 MIL/uL (ref 3.87–5.11)
RDW: 13.1 % (ref 11.5–15.5)
WBC: 31.8 10*3/uL — ABNORMAL HIGH (ref 4.0–10.5)
nRBC: 0 % (ref 0.0–0.2)
nRBC: 0 /100 WBC

## 2021-07-19 LAB — URINALYSIS, MICROSCOPIC (REFLEX): WBC, UA: >50 WBC/hpf (ref 0–5)

## 2021-07-19 LAB — PREGNANCY, URINE: Preg Test, Ur: NEGATIVE

## 2021-07-19 LAB — RESP PANEL BY RT-PCR (FLU A&B, COVID) ARPGX2
Influenza A by PCR: NEGATIVE
Influenza B by PCR: NEGATIVE
SARS Coronavirus 2 by RT PCR: NEGATIVE

## 2021-07-19 LAB — LACTIC ACID, PLASMA: Lactic Acid, Venous: 3.8 mmol/L (ref 0.5–1.9)

## 2021-07-19 MED ORDER — ACETAMINOPHEN 500 MG PO TABS
1000.0000 mg | ORAL_TABLET | Freq: Once | ORAL | Status: AC
  Administered 2021-07-20: 1000 mg via ORAL
  Filled 2021-07-19: qty 2

## 2021-07-19 MED ORDER — ACETAMINOPHEN 500 MG PO TABS
1000.0000 mg | ORAL_TABLET | Freq: Once | ORAL | Status: AC
  Administered 2021-07-19: 1000 mg via ORAL
  Filled 2021-07-19: qty 2

## 2021-07-19 NOTE — ED Provider Notes (Signed)
Emergency Medicine Provider Triage Evaluation Note  Wendy Oconnell , a 35 y.o. female  was evaluated in triage.  Pt complains of fever and body aches headache went to urgent care and was flu negative.  COVID is pending..  Review of Systems  Positive: Fever body aches dark urine Negative: Abdominal pain  Physical Exam  BP 91/67 (BP Location: Left Arm)   Pulse (!) 149   Temp (!) 103.2 F (39.6 C) (Oral)   Resp (!) 24   LMP 07/08/2021   SpO2 100%  Gen:   Awake, no distress   Resp:  Normal effort  MSK:   Moves extremities without difficulty  Other:  Tachycardic and febrile  Medical Decision Making  Medically screening exam initiated at 9:53 PM.  Appropriate orders placed.  Mahsa Hanser was informed that the remainder of the evaluation will be completed by another provider, this initial triage assessment does not replace that evaluation, and the importance of remaining in the ED until their evaluation is complete.     Terrilee Files, MD 07/19/21 2242

## 2021-07-19 NOTE — ED Triage Notes (Signed)
Patient has been sick since Monday, patient with tachycardia.  Patient with fever and HA.  Patient has body aches and she states that she has been tylenol for fevers.

## 2021-07-19 NOTE — ED Notes (Signed)
Triage aware of temp and hr

## 2021-07-20 ENCOUNTER — Emergency Department (HOSPITAL_COMMUNITY): Payer: 59

## 2021-07-20 ENCOUNTER — Inpatient Hospital Stay (HOSPITAL_COMMUNITY): Payer: 59

## 2021-07-20 DIAGNOSIS — N12 Tubulo-interstitial nephritis, not specified as acute or chronic: Secondary | ICD-10-CM | POA: Diagnosis present

## 2021-07-20 DIAGNOSIS — Z8249 Family history of ischemic heart disease and other diseases of the circulatory system: Secondary | ICD-10-CM | POA: Diagnosis not present

## 2021-07-20 DIAGNOSIS — R102 Pelvic and perineal pain: Secondary | ICD-10-CM | POA: Diagnosis present

## 2021-07-20 DIAGNOSIS — R6520 Severe sepsis without septic shock: Secondary | ICD-10-CM

## 2021-07-20 DIAGNOSIS — R809 Proteinuria, unspecified: Secondary | ICD-10-CM | POA: Diagnosis present

## 2021-07-20 DIAGNOSIS — D509 Iron deficiency anemia, unspecified: Secondary | ICD-10-CM | POA: Diagnosis present

## 2021-07-20 DIAGNOSIS — N179 Acute kidney failure, unspecified: Secondary | ICD-10-CM | POA: Diagnosis present

## 2021-07-20 DIAGNOSIS — E871 Hypo-osmolality and hyponatremia: Secondary | ICD-10-CM | POA: Diagnosis present

## 2021-07-20 DIAGNOSIS — N10 Acute tubulo-interstitial nephritis: Secondary | ICD-10-CM | POA: Diagnosis not present

## 2021-07-20 DIAGNOSIS — Z79899 Other long term (current) drug therapy: Secondary | ICD-10-CM | POA: Diagnosis not present

## 2021-07-20 DIAGNOSIS — R630 Anorexia: Secondary | ICD-10-CM | POA: Diagnosis present

## 2021-07-20 DIAGNOSIS — F909 Attention-deficit hyperactivity disorder, unspecified type: Secondary | ICD-10-CM | POA: Diagnosis present

## 2021-07-20 DIAGNOSIS — Z20822 Contact with and (suspected) exposure to covid-19: Secondary | ICD-10-CM | POA: Diagnosis present

## 2021-07-20 DIAGNOSIS — A419 Sepsis, unspecified organism: Principal | ICD-10-CM

## 2021-07-20 DIAGNOSIS — Z23 Encounter for immunization: Secondary | ICD-10-CM | POA: Diagnosis not present

## 2021-07-20 DIAGNOSIS — E860 Dehydration: Secondary | ICD-10-CM | POA: Diagnosis present

## 2021-07-20 DIAGNOSIS — Z6825 Body mass index (BMI) 25.0-25.9, adult: Secondary | ICD-10-CM | POA: Diagnosis not present

## 2021-07-20 DIAGNOSIS — E876 Hypokalemia: Secondary | ICD-10-CM | POA: Diagnosis present

## 2021-07-20 LAB — BASIC METABOLIC PANEL
Anion gap: 8 (ref 5–15)
BUN: 13 mg/dL (ref 6–20)
CO2: 16 mmol/L — ABNORMAL LOW (ref 22–32)
Calcium: 7.6 mg/dL — ABNORMAL LOW (ref 8.9–10.3)
Chloride: 107 mmol/L (ref 98–111)
Creatinine, Ser: 1.1 mg/dL — ABNORMAL HIGH (ref 0.44–1.00)
GFR, Estimated: >60 mL/min (ref >60)
Glucose, Bld: 158 mg/dL — ABNORMAL HIGH (ref 70–99)
Potassium: 3.4 mmol/L — ABNORMAL LOW (ref 3.5–5.1)
Sodium: 131 mmol/L — ABNORMAL LOW (ref 135–145)

## 2021-07-20 LAB — IRON AND TIBC
Iron: 14 ug/dL — ABNORMAL LOW (ref 28–170)
Saturation Ratios: 7 % — ABNORMAL LOW (ref 10.4–31.8)
TIBC: 207 ug/dL — ABNORMAL LOW (ref 250–450)
UIBC: 193 ug/dL

## 2021-07-20 LAB — FOLATE: Folate: 9.9 ng/mL (ref >5.9)

## 2021-07-20 LAB — HIV ANTIBODY (ROUTINE TESTING W REFLEX): HIV Screen 4th Generation wRfx: NONREACTIVE

## 2021-07-20 LAB — HCG, QUANTITATIVE, PREGNANCY: hCG, Beta Chain, Quant, S: 5 m[IU]/mL — ABNORMAL HIGH (ref <5)

## 2021-07-20 LAB — RETICULOCYTES
Immature Retic Fract: 4.9 % (ref 2.3–15.9)
RBC.: 3.79 MIL/uL — ABNORMAL LOW (ref 3.87–5.11)
Retic Count, Absolute: 27.3 10*3/uL (ref 19.0–186.0)
Retic Ct Pct: 0.7 % (ref 0.4–3.1)

## 2021-07-20 LAB — LACTIC ACID, PLASMA: Lactic Acid, Venous: 1.8 mmol/L (ref 0.5–1.9)

## 2021-07-20 LAB — CK: Total CK: 13 U/L — ABNORMAL LOW (ref 38–234)

## 2021-07-20 LAB — SARS-COV-2, NAA 2 DAY TAT

## 2021-07-20 LAB — FERRITIN: Ferritin: 783 ng/mL — ABNORMAL HIGH (ref 11–307)

## 2021-07-20 LAB — NOVEL CORONAVIRUS, NAA: SARS-CoV-2, NAA: NOT DETECTED

## 2021-07-20 LAB — VITAMIN B12: Vitamin B-12: 1237 pg/mL — ABNORMAL HIGH (ref 180–914)

## 2021-07-20 LAB — MAGNESIUM: Magnesium: 2 mg/dL (ref 1.7–2.4)

## 2021-07-20 MED ORDER — ONDANSETRON HCL 4 MG PO TABS: 4.0000 mg | ORAL_TABLET | Freq: Four times a day (QID) | ORAL | Status: DC | PRN

## 2021-07-20 MED ORDER — MAGNESIUM SULFATE 2 GM/50ML IV SOLN
2.0000 g | Freq: Once | INTRAVENOUS | Status: AC
  Administered 2021-07-20: 2 g via INTRAVENOUS
  Filled 2021-07-20: qty 50

## 2021-07-20 MED ORDER — POTASSIUM CHLORIDE CRYS ER 20 MEQ PO TBCR
40.0000 meq | EXTENDED_RELEASE_TABLET | Freq: Four times a day (QID) | ORAL | Status: AC
  Administered 2021-07-20 (×2): 40 meq via ORAL
  Filled 2021-07-20 (×2): qty 2

## 2021-07-20 MED ORDER — POTASSIUM CHLORIDE 10 MEQ/100ML IV SOLN
10.0000 meq | INTRAVENOUS | Status: AC
  Administered 2021-07-20 (×3): 10 meq via INTRAVENOUS
  Filled 2021-07-20 (×3): qty 100

## 2021-07-20 MED ORDER — AMPHETAMINE-DEXTROAMPHET ER 10 MG PO CP24
30.0000 mg | ORAL_CAPSULE | Freq: Every day | ORAL | Status: DC
  Administered 2021-07-20 – 2021-07-22 (×3): 30 mg via ORAL
  Filled 2021-07-20: qty 3
  Filled 2021-07-20: qty 1
  Filled 2021-07-20 (×2): qty 3

## 2021-07-20 MED ORDER — HYDROMORPHONE HCL 1 MG/ML IJ SOLN
0.5000 mg | Freq: Once | INTRAMUSCULAR | Status: AC
  Administered 2021-07-20: 0.5 mg via INTRAVENOUS
  Filled 2021-07-20: qty 1

## 2021-07-20 MED ORDER — HYDROMORPHONE HCL 1 MG/ML IJ SOLN
0.5000 mg | INTRAMUSCULAR | Status: DC | PRN
  Administered 2021-07-20 – 2021-07-21 (×7): 1 mg via INTRAVENOUS
  Filled 2021-07-20 (×8): qty 1

## 2021-07-20 MED ORDER — HEPARIN SODIUM (PORCINE) 5000 UNIT/ML IJ SOLN
5000.0000 [IU] | Freq: Three times a day (TID) | INTRAMUSCULAR | Status: DC
  Administered 2021-07-20 – 2021-07-22 (×6): 5000 [IU] via SUBCUTANEOUS
  Filled 2021-07-20 (×7): qty 1

## 2021-07-20 MED ORDER — INFLUENZA VAC SPLIT QUAD 0.5 ML IM SUSY
0.5000 mL | PREFILLED_SYRINGE | INTRAMUSCULAR | Status: AC
  Administered 2021-07-22: 09:00:00 0.5 mL via INTRAMUSCULAR
  Filled 2021-07-20: qty 0.5

## 2021-07-20 MED ORDER — SODIUM CHLORIDE 0.9 % IV SOLN
2.0000 g | INTRAVENOUS | Status: DC
  Administered 2021-07-21 – 2021-07-22 (×2): 2 g via INTRAVENOUS
  Filled 2021-07-20 (×2): qty 20

## 2021-07-20 MED ORDER — ACETAMINOPHEN 500 MG PO TABS: 1000.0000 mg | ORAL_TABLET | Freq: Four times a day (QID) | ORAL | Status: DC | PRN

## 2021-07-20 MED ORDER — ONDANSETRON HCL 4 MG/2ML IJ SOLN: 4.0000 mg | Freq: Four times a day (QID) | INTRAMUSCULAR | Status: DC | PRN

## 2021-07-20 MED ORDER — ACETAMINOPHEN 650 MG RE SUPP: 650.0000 mg | Freq: Four times a day (QID) | RECTAL | Status: DC | PRN

## 2021-07-20 MED ORDER — SODIUM CHLORIDE 0.9 % IV SOLN
2.0000 g | Freq: Once | INTRAVENOUS | Status: AC
  Administered 2021-07-20: 2 g via INTRAVENOUS
  Filled 2021-07-20: qty 20

## 2021-07-20 MED ORDER — SODIUM CHLORIDE 0.9 % IV SOLN: INTRAVENOUS | Status: AC

## 2021-07-20 MED ORDER — LACTATED RINGERS IV BOLUS
2000.0000 mL | Freq: Once | INTRAVENOUS | Status: AC
  Administered 2021-07-20: 2000 mL via INTRAVENOUS

## 2021-07-20 MED ORDER — ACETAMINOPHEN 325 MG PO TABS
650.0000 mg | ORAL_TABLET | Freq: Four times a day (QID) | ORAL | Status: DC | PRN
  Administered 2021-07-20 – 2021-07-22 (×3): 650 mg via ORAL
  Filled 2021-07-20 (×3): qty 2

## 2021-07-20 NOTE — ED Notes (Signed)
Got patient to sprites patient is resting with family at bedside and call bell in reach

## 2021-07-20 NOTE — ED Notes (Signed)
Pt ambulated to restroom independently.

## 2021-07-20 NOTE — ED Provider Notes (Signed)
[facility]EMERGENCY DEPT [facility] Emergency Department Provider Note MRN:  [id number]  Arrival date & time: 07/20/21     Chief Complaint   Body aches History of Present Illness   Wendy Oconnell is a 35 y.o. year-old female with no pertinent past medical presenting to the ED with chief complaint of body aches.  Diffuse body aches for 1 week, described as severe, causing her to moan and unable to be still all night.  Today with fever, elevated heart rate, right-sided abdominal/flank pain.  Denies headache or vision change, no chest pain or shortness of breath.  Some cloudiness to urine recently.  Review of Systems  A complete 10 system review of systems was obtained and all systems are negative except as noted in the HPI and PMH.   Patient's Health History   History reviewed. No pertinent past medical history.  Past Surgical History:  Procedure Laterality Date   ECTOPIC PREGNANCY SURGERY Left 2013    Family History  Problem Relation Age of Onset   Hypertension Father     Social History   Socioeconomic History   Marital status: Married    Spouse name: Not on file   Number of children: Not on file   Years of education: Not on file   Highest education level: Not on file  Occupational History   Not on file  Tobacco Use   Smoking status: Never   Smokeless tobacco: Never  Vaping Use   Vaping Use: Never used  Substance and Sexual Activity   Alcohol use: No   Drug use: No   Sexual activity: Yes    Birth control/protection: None  Other Topics Concern   Not on file  Social History Narrative   Not on file   Social Determinants of Health   Financial Resource Strain: Not on file  Food Insecurity: Not on file  Transportation Needs: Not on file  Physical Activity: Not on file  Stress: Not on file  Social Connections: Not on file  Intimate Partner Violence: Not on file     Physical Exam   Vitals:   07/20/21 0545 07/20/21 0626  BP: 104/74 100/64  Pulse: 93 88  Resp:  20 (!) 22  Temp:    SpO2: 100% 100%    CONSTITUTIONAL: Well-appearing, NAD NEURO:  Alert and oriented x 3, no focal deficits EYES:  eyes equal and reactive ENT/NECK:  no LAD, no JVD CARDIO: Regular rate, well-perfused, normal S1 and S2 PULM:  CTAB no wheezing or rhonchi GI/GU:  normal bowel sounds, non-distended, tenderness palpation to right lower quadrant MSK/SPINE:  No gross deformities, no edema SKIN:  no rash, atraumatic PSYCH:  Appropriate speech and behavior  *Additional and/or pertinent findings included in MDM below  Diagnostic and Interventional Summary    EKG Interpretation  Date/Time:  Monday July 19 2021 21:56:24 EST Ventricular Rate:  119 PR Interval:  132 QRS Duration: 96 QT Interval:  324 QTC Calculation: 455 R Axis:   78 Text Interpretation: Sinus tachycardia Biatrial enlargement Nonspecific ST and T wave abnormality Abnormal ECG Confirmed by Kennis Carina [phone]) on 07/20/2021 6:32:25 AM       Labs Reviewed  COMPREHENSIVE METABOLIC PANEL - Abnormal; Notable for the following components:      Result Value   Sodium 127 (*)    Potassium 2.5 (*)    Chloride 96 (*)    CO2 19 (*)    Glucose, Bld 149 (*)    BUN 34 (*)    Creatinine, Ser 3.95 (*)  Calcium 8.5 (*)    Albumin 3.0 (*)    GFR, Estimated 14 (*)    All other components within normal limits  CBC WITH DIFFERENTIAL/PLATELET - Abnormal; Notable for the following components:   WBC 31.8 (*)    Hemoglobin 10.4 (*)    HCT 31.4 (*)    MCV 72.9 (*)    MCH 24.1 (*)    Neutro Abs 27.7 (*)    Monocytes Absolute 1.3 (*)    Basophils Absolute 0.3 (*)    All other components within normal limits  LACTIC ACID, PLASMA - Abnormal; Notable for the following components:   Lactic Acid, Venous 3.8 (*)    All other components within normal limits  URINALYSIS, ROUTINE W REFLEX MICROSCOPIC - Abnormal; Notable for the following components:   APPearance TURBID (*)    Glucose, UA 100 (*)    Hgb urine  dipstick MODERATE (*)    Bilirubin Urine SMALL (*)    Protein, ur >300 (*)    Leukocytes,Ua MODERATE (*)    All other components within normal limits  URINALYSIS, MICROSCOPIC (REFLEX) - Abnormal; Notable for the following components:   Bacteria, UA MANY (*)    All other components within normal limits  CK - Abnormal; Notable for the following components:   Total CK 13 (*)    All other components within normal limits  HCG, QUANTITATIVE, PREGNANCY - Abnormal; Notable for the following components:   hCG, Beta Chain, Quant, S 5 (*)    All other components within normal limits  I-STAT BETA HCG BLOOD, ED ([facility], [facility], [facility] ONLY) - Abnormal; Notable for the following components:   I-stat hCG, quantitative 53.8 (*)    All other components within normal limits  RESP PANEL BY RT-PCR (FLU A&B, COVID) ARPGX2  CULTURE, BLOOD (ROUTINE X 2)  CULTURE, BLOOD (ROUTINE X 2)  URINE CULTURE  PREGNANCY, URINE  LACTIC ACID, PLASMA  LACTIC ACID, PLASMA    US OB LESS THAN 14 WEEKS W/ OB TRANSVAGINAL AND DOPPLER  Final Result    US Renal  Final Result    DG Chest 2 View  Final Result    CT ABDOMEN PELVIS WO CONTRAST    (Results Pending)    Medications  acetaminophen (TYLENOL) tablet 1,000 mg (0 mg Oral Hold 07/20/21 0417)  potassium chloride 10 mEq in 100 mL IVPB (10 mEq Intravenous New Bag/Given 07/20/21 0600)  acetaminophen (TYLENOL) tablet 1,000 mg (1,000 mg Oral Given 07/19/21 2152)  lactated ringers bolus 2,000 mL (0 mLs Intravenous Stopped 07/20/21 0630)  cefTRIAXone (ROCEPHIN) 2 g in sodium chloride 0.9 % 100 mL IVPB (0 g Intravenous Stopped 07/20/21 0446)  HYDROmorphone (DILAUDID) injection 0.5 mg (0.5 mg Intravenous Given 07/20/21 0401)  magnesium sulfate IVPB 2 g 50 mL (0 g Intravenous Stopped 07/20/21 0551)     Procedures  /  Critical Care .Critical Care Performed by: Sabas Sous, MD Authorized by: Sabas Sous, MD   Critical care provider statement:    Critical care time  (minutes):  33   Critical care was necessary to treat or prevent imminent or life-threatening deterioration of the following conditions:  Sepsis and metabolic crisis (Acute renal failure)   Critical care was time spent personally by me on the following activities:  Ordering and performing treatments and interventions, ordering and review of laboratory studies, ordering and review of radiographic studies, re-evaluation of patient's condition, review of old charts, examination of patient, discussions with consultants and evaluation of patient's response to  treatment  ED Course and Medical Decision Making  I have reviewed the triage vital signs, the nursing notes, and pertinent available records from the EMR.  Listed above are laboratory and imaging tests that I personally ordered, reviewed, and interpreted and then considered in my medical decision making (see below for details).  Differential diagnosis includes rhabdomyolysis, flu.  Patient's initial laboratory evaluation reveals significant abnormalities, marked leukocytosis, AKI, hypokalemia, elevated lactate all suggestive of a more sinister underlying pathology such as sepsis.  Urinalysis showing evidence of infection.  Patient arrived febrile and tachycardic, vital signs have improved with Tylenol given in the waiting room.  She is now a code sepsis alert, providing fluids, antibiotics, repeating electrolytes.  hCG i-STAT is weakly positive, suspect false positive, will obtain formal and if negative will obtain CT imaging to evaluate for intra-abdominal abscess, ruptured appendicitis, kidney stone, pyelonephritis     Formal hCG is also weakly positive, will hold off on CT imaging.  Ultrasound imaging is overall reassuring.  Patient will be admitted to medicine for further care.  Elmer Sow. Pilar Plate, MD [facility] Emergency Medicine [facility] [email or web address]  Final Clinical Impressions(s) / ED Diagnoses     ICD-10-CM   1.  Sepsis, due to unspecified organism, unspecified whether acute organ dysfunction present (HCC)  A41.9     2. Abdominal pain  R10.9 CANCELED: US PELVIC DOPPLER (TORSION R/O OR MASS ARTERIAL FLOW)    CANCELED: US PELVIC DOPPLER (TORSION R/O OR MASS ARTERIAL FLOW)    3. Pelvic pain  R10.2 US OB LESS THAN 14 WEEKS W/ OB TRANSVAGINAL AND DOPPLER    US OB LESS THAN 14 WEEKS W/ OB TRANSVAGINAL AND DOPPLER      ED Discharge Orders     None        Discharge Instructions Discussed with and Provided to Patient:   Discharge Instructions   None       Sabas Sous, MD 07/20/21 [phone]

## 2021-07-20 NOTE — Sepsis Progress Note (Signed)
Elink following for Sepsis Protocol 

## 2021-07-20 NOTE — ED Notes (Signed)
RN told Dr. Pilar Plate of BP 93/66

## 2021-07-20 NOTE — Plan of Care (Signed)
  Problem: Education: Goal: Knowledge of General Education information will improve Description Including pain rating scale, medication(s)/side effects and non-pharmacologic comfort measures Outcome: Progressing   Problem: Health Behavior/Discharge Planning: Goal: Ability to manage health-related needs will improve Outcome: Progressing   

## 2021-07-20 NOTE — H&P (Addendum)
History and Physical    Wendy Oconnell:[id number] DOB: 1986/04/16 DOA: 07/19/2021  PCP: Patient, No Pcp Per (Inactive)   Patient coming from: Home  Chief Complaint: Body aches/flank pain x 1 week  HPI: Wendy Oconnell is a 35 y.o. female with medical history significant for prior ectopic pregnancy as well as ADHD who presented to the ED with complaints of all over body aches for approximately 1 week especially to her right flank region.  She has had poor appetite and has not been eating or drinking much over the last week.  She also claims to have had diarrhea that has been ongoing.  She has had intermittent fevers and chills as well.  She denies any cough or congestion and states that her 2 children have been ill with an upper respiratory infection recently.  She claims to have some mild dysuria, but no hematuria.  She is currently sexually active with her husband and denies any significant nausea or vomiting.   ED Course: Vital signs with fever noted initially with temperature 103.2 F which has improved to 97.8.  Initial lactic acid 3.8 which has improved to 1.8.  Leukocytosis of 31,800 and hemoglobin 10.4.  Potassium 2.5.  Sodium 127.  Creatinine 3.95 and BUN 34.  Renal ultrasound with no acute findings and CT abdomen with findings of pyelonephritis to right kidney.  Beta-hCG minimally elevated and transvaginal ultrasound with no intrauterine pregnancy noted.  She has been started on Rocephin as well as IV fluid.  Review of Systems: Reviewed as noted above, otherwise negative.  History reviewed. No pertinent past medical history.  Past Surgical History:  Procedure Laterality Date   ECTOPIC PREGNANCY SURGERY Left 2013     reports that she has never smoked. She has never used smokeless tobacco. She reports that she does not drink alcohol and does not use drugs.  No Known Allergies  Family History  Problem Relation Age of Onset   Hypertension Father     Prior to Admission medications    Medication Sig Start Date End Date Taking? Authorizing Provider  acetaminophen (TYLENOL) 500 MG tablet Take 1,000 mg by mouth every 6 (six) hours as needed for moderate pain or headache.   Yes [provider]  ADDERALL XR 30 MG 24 hr capsule Take 30 mg by mouth daily. 07/02/21  Yes [provider]  ibuprofen (ADVIL) 200 MG tablet Take 600 mg by mouth every 6 (six) hours as needed for headache or moderate pain.   Yes [provider]  naproxen sodium (ALEVE) 220 MG tablet Take 440 mg by mouth daily as needed (pain/headache).   Yes [provider]  benzonatate (TESSALON) 100 MG capsule Take 1 capsule (100 mg total) by mouth every 8 (eight) hours. Patient not taking: No sig reported 08/31/20   Hall-Potvin, [place], PA-C  cetirizine (ZYRTEC ALLERGY) 10 MG tablet Take 1 tablet (10 mg total) by mouth daily. Patient not taking: No sig reported 08/31/20   Hall-Potvin, [place], PA-C  fluticasone (FLONASE) 50 MCG/ACT nasal spray Place 1 spray into both nostrils daily. Patient not taking: No sig reported 08/31/20   Wendy Oconnell, [place]    Physical Exam: Vitals:   07/20/21 0652 07/20/21 0700 07/20/21 0730 07/20/21 0818  BP: 93/66 96/62 96/68  97/63  Pulse: 83 85 83 88  Resp: 19 19 18 20   Temp:      TempSrc:      SpO2: 100% 99% 100% 100%    Constitutional: NAD, calm, comfortable Vitals:   07/20/21  [id number] 07/20/21 0700 07/20/21 0730 07/20/21 0818  BP: 93/66 96/62 96/68  97/63  Pulse: 83 85 83 88  Resp: 19 19 18 20   Temp:      TempSrc:      SpO2: 100% 99% 100% 100%   Eyes: lids and conjunctivae normal Neck: normal, supple Respiratory: clear to auscultation bilaterally. Normal respiratory effort. No accessory muscle use.  Cardiovascular: Regular rate and rhythm, no murmurs. Abdomen: no tenderness, no distention. Bowel sounds positive.  Musculoskeletal:  No edema. Skin: no rashes, lesions, ulcers.  Psychiatric: Flat affect  Labs on Admission: I  have personally reviewed following labs and imaging studies  CBC: Recent Labs  Lab 07/19/21 2203  WBC 31.8*  NEUTROABS 27.7*  HGB 10.4*  HCT 31.4*  MCV 72.9*  PLT [id number]   Basic Metabolic Panel: Recent Labs  Lab 07/19/21 2203  NA 127*  K 2.5*  CL 96*  CO2 19*  GLUCOSE 149*  BUN 34*  CREATININE 3.95*  CALCIUM 8.5*   GFR: CrCl cannot be calculated (Unknown ideal weight.). Liver Function Tests: Recent Labs  Lab 07/19/21 2203  AST 25  ALT 16  ALKPHOS 94  BILITOT 1.1  PROT 7.6  ALBUMIN 3.0*   No results for input(s): LIPASE, AMYLASE in the last 168 hours. No results for input(s): AMMONIA in the last 168 hours. Coagulation Profile: No results for input(s): INR, PROTIME in the last 168 hours. Cardiac Enzymes: Recent Labs  Lab 07/19/21 2203  CKTOTAL 13*   BNP (last 3 results) No results for input(s): PROBNP in the last 8760 hours. HbA1C: No results for input(s): HGBA1C in the last 72 hours. CBG: No results for input(s): GLUCAP in the last 168 hours. Lipid Profile: No results for input(s): CHOL, HDL, LDLCALC, TRIG, CHOLHDL, LDLDIRECT in the last 72 hours. Thyroid Function Tests: No results for input(s): TSH, T4TOTAL, FREET4, T3FREE, THYROIDAB in the last 72 hours. Anemia Panel: No results for input(s): VITAMINB12, FOLATE, FERRITIN, TIBC, IRON, RETICCTPCT in the last 72 hours. Urine analysis:    Component Value Date/Time   COLORURINE YELLOW 07/19/2021 2153   APPEARANCEUR TURBID (A) 07/19/2021 2153   LABSPEC 1.025 07/19/2021 2153   PHURINE 5.0 07/19/2021 2153   GLUCOSEU 100 (A) 07/19/2021 2153   HGBUR MODERATE (A) 07/19/2021 2153   BILIRUBINUR SMALL (A) 07/19/2021 2153   KETONESUR NEGATIVE 07/19/2021 2153   PROTEINUR >300 (A) 07/19/2021 2153   NITRITE NEGATIVE 07/19/2021 2153   LEUKOCYTESUR MODERATE (A) 07/19/2021 2153    Radiological Exams on Admission: CT ABDOMEN PELVIS WO CONTRAST  Result Date: 07/20/2021 CLINICAL DATA:  Concern for infection  EXAM: CT ABDOMEN AND PELVIS WITHOUT CONTRAST TECHNIQUE: Multidetector CT imaging of the abdomen and pelvis was performed following the standard protocol without IV contrast. COMPARISON:  None. FINDINGS: Lower chest: No acute abnormality. Hepatobiliary: No focal liver abnormality is seen. No gallstones, gallbladder wall thickening, or biliary dilatation. Pancreas: Unremarkable. No pancreatic ductal dilatation or surrounding inflammatory changes. Spleen: Normal in size without focal abnormality. Adrenals/Urinary Tract: Bilateral adrenal glands are unremarkable. Mild asymmetric fat stranding about the right kidney. No hydronephrosis or nephrolithiasis. Bladder is unremarkable. Stomach/Bowel: Stomach is within normal limits. Appendix is not visualized. No evidence of bowel wall thickening, distention, or inflammatory changes. Vascular/Lymphatic: No significant vascular findings are present. No enlarged abdominal or pelvic lymph nodes. Reproductive: Uterus and bilateral adnexa are unremarkable. Other: No abdominal wall hernia or abnormality. No abdominopelvic ascites. Musculoskeletal: No acute or significant osseous findings. IMPRESSION: Mild asymmetric fat stranding about the right  kidney, concerning for infection. Correlate with urinalysis. Electronically Signed   By: Yetta Glassman M.D.   On: 07/20/2021 08:05   DG Chest 2 View  Result Date: 07/19/2021 CLINICAL DATA:  Fever EXAM: CHEST - 2 VIEW COMPARISON:  10/21/2017 FINDINGS: The heart size and mediastinal contours are within normal limits. Both lungs are clear. The visualized skeletal structures are unremarkable. IMPRESSION: No active cardiopulmonary disease. Electronically Signed   By: Rolm Baptise M.D.   On: 07/19/2021 22:32   US Renal  Result Date: 07/20/2021 CLINICAL DATA:  35 year old female with flank and pelvic pain. EXAM: RENAL / URINARY TRACT ULTRASOUND COMPLETE COMPARISON:  CT Abdomen and Pelvis 09/06/2008. FINDINGS: Right Kidney: Renal  measurements: 12.4 x 5.8 x 6.3 cm = volume: 240 mL. Cortical echogenicity and corticomedullary differentiation are within normal limits. No right hydronephrosis or renal lesion. Left Kidney: Renal measurements: 11.8 x 5.8 x 5.8 cm = volume: 206 mL. Echogenicity within normal limits. No mass or hydronephrosis visualized. Bladder: Appears normal for degree of bladder distention. Bladder volume estimated at 117 mL. Both ureteral jets detected with Doppler. Other: None. IMPRESSION: Ultrasound appearance of both kidneys and bladder within normal limits. Electronically Signed   By: Genevie Ann M.D.   On: 07/20/2021 05:37   US OB LESS THAN 14 WEEKS W/ OB TRANSVAGINAL AND DOPPLER  Result Date: 07/20/2021 CLINICAL DATA:  35 year old female with flank and pelvic pain. Quantitative beta HCG = 5. EXAM: TRANSABDOMINAL AND TRANSVAGINAL ULTRASOUND OF PELVIS DOPPLER ULTRASOUND OF OVARIES TECHNIQUE: Both transabdominal and transvaginal ultrasound examinations of the pelvis were performed. Transabdominal technique was performed for global imaging of the pelvis including uterus, ovaries, adnexal regions, and pelvic cul-de-sac. It was necessary to proceed with endovaginal exam following the transabdominal exam to visualize the endometrium, ovaries. Color and duplex Doppler ultrasound was utilized to evaluate blood flow to the ovaries. COMPARISON:  Ob ultrasound 05/18/2009. CT Abdomen and Pelvis 09/06/2008. FINDINGS: Uterus Measurements: 9.8 x 5.6 x 6.6 cm. No fibroids or other mass visualized. Endometrium Thickness: 8 mm.  No gestational sac identified. Right ovary Measurements: 3.1 x 1.8 x 2.2 cm = volume: 7 mL. Normal appearance/no adnexal mass, multiple small follicles (image 35). Left ovary Measurements: 2.7 x 1.6 x 3.3 cm = volume: 7 mL. Normal appearance/no adnexal mass. Pulsed Doppler evaluation of both ovaries demonstrates normal low-resistance arterial and venous waveforms. Other findings Small volume of mildly complex free  fluid in the cul-de-sac. IMPRESSION: 1. No IUP identified, and no adnexal mass. Recommend serial quantitative beta HCG, and repeat Ultrasound as necessary. 2. No evidence of ovarian torsion. A small volume of mildly complex free fluid in the cul-de-sac is nonspecific. Electronically Signed   By: Genevie Ann M.D.   On: 07/20/2021 05:41    EKG: Independently reviewed. Sinus tach 119bpm.  Assessment/Plan Active Problems:   Severe sepsis (HCC)    Severe sepsis, POA secondary to UTI/right pyelonephritis -Started empirically on Rocephin, continue same -Urine culture pending -Lactic acidosis improved -Check repeat CBC  AKI-prerenal -No baseline creatinine for comparison -Recent NSAID use noted -Related to above -Continue aggressive IV fluids and monitor with repeat labs and urine output -Avoid nephrotoxic agents -No signs of hydronephrosis/obstruction noted -CK level 13  Hypokalemia -Likely secondary to poor oral intake with diarrhea -Supplement and continue monitoring -Check magnesium  Hyponatremia -Likely related to dehydration -Continue IV normal saline -Repeat labs in a.m. -Could consider TSH, and urine and serum osmolarity's if needed  Microcytic anemia -Check anemia panel  Beta-hCG elevation -  Transvaginal ultrasound negative for intrauterine pregnancy -We will order repeat repeat eval in 48 hours as recommended by Gyn, if increasing, she may follow-up with outpatient gynecology, otherwise this finding should not affect her inpatient stay.   DVT prophylaxis: Heparin Code Status: Full Family Communication: None at bedside Disposition Plan:Admit for treatment of right pyelonephritis Consults called:OB Admission status: Inpatient, Tele   Rithvik Orcutt D Kenni Newton DO [facility]  If 7PM-7AM, please contact night-coverage [email or web address]  07/20/2021, 8:36 AM

## 2021-07-20 NOTE — ED Notes (Signed)
Patient transported to Ultrasound 

## 2021-07-21 DIAGNOSIS — N10 Acute tubulo-interstitial nephritis: Secondary | ICD-10-CM

## 2021-07-21 DIAGNOSIS — F909 Attention-deficit hyperactivity disorder, unspecified type: Secondary | ICD-10-CM

## 2021-07-21 DIAGNOSIS — E876 Hypokalemia: Secondary | ICD-10-CM

## 2021-07-21 LAB — CBC
HCT: 23.2 % — ABNORMAL LOW (ref 36.0–46.0)
HCT: 24.4 % — ABNORMAL LOW (ref 36.0–46.0)
Hemoglobin: 8.3 g/dL — ABNORMAL LOW (ref 12.0–15.0)
Hemoglobin: 8.3 g/dL — ABNORMAL LOW (ref 12.0–15.0)
MCH: 24.2 pg — ABNORMAL LOW (ref 26.0–34.0)
MCH: 25.2 pg — ABNORMAL LOW (ref 26.0–34.0)
MCHC: 34 g/dL (ref 30.0–36.0)
MCHC: 35.8 g/dL (ref 30.0–36.0)
MCV: 70.5 fL — ABNORMAL LOW (ref 80.0–100.0)
MCV: 71.1 fL — ABNORMAL LOW (ref 80.0–100.0)
Platelets: 307 10*3/uL (ref 150–400)
Platelets: 324 10*3/uL (ref 150–400)
RBC: 3.29 MIL/uL — ABNORMAL LOW (ref 3.87–5.11)
RBC: 3.43 MIL/uL — ABNORMAL LOW (ref 3.87–5.11)
RDW: 13 % (ref 11.5–15.5)
RDW: 13 % (ref 11.5–15.5)
WBC: 21.8 10*3/uL — ABNORMAL HIGH (ref 4.0–10.5)
WBC: 23.6 10*3/uL — ABNORMAL HIGH (ref 4.0–10.5)
nRBC: 0 % (ref 0.0–0.2)
nRBC: 0 % (ref 0.0–0.2)

## 2021-07-21 LAB — BASIC METABOLIC PANEL
Anion gap: 8 (ref 5–15)
BUN: 12 mg/dL (ref 6–20)
CO2: 16 mmol/L — ABNORMAL LOW (ref 22–32)
Calcium: 7.6 mg/dL — ABNORMAL LOW (ref 8.9–10.3)
Chloride: 106 mmol/L (ref 98–111)
Creatinine, Ser: 1.04 mg/dL — ABNORMAL HIGH (ref 0.44–1.00)
GFR, Estimated: >60 mL/min (ref >60)
Glucose, Bld: 131 mg/dL — ABNORMAL HIGH (ref 70–99)
Potassium: 3.2 mmol/L — ABNORMAL LOW (ref 3.5–5.1)
Sodium: 130 mmol/L — ABNORMAL LOW (ref 135–145)

## 2021-07-21 LAB — MAGNESIUM: Magnesium: 2.1 mg/dL (ref 1.7–2.4)

## 2021-07-21 MED ORDER — POTASSIUM CHLORIDE CRYS ER 20 MEQ PO TBCR
40.0000 meq | EXTENDED_RELEASE_TABLET | ORAL | Status: AC
  Administered 2021-07-21 (×2): 40 meq via ORAL
  Filled 2021-07-21 (×2): qty 2

## 2021-07-21 MED ORDER — OXYCODONE HCL 5 MG PO TABS
5.0000 mg | ORAL_TABLET | Freq: Four times a day (QID) | ORAL | Status: DC | PRN
  Administered 2021-07-21 – 2021-07-22 (×3): 5 mg via ORAL
  Filled 2021-07-21 (×3): qty 1

## 2021-07-21 NOTE — Progress Notes (Signed)
   07/20/21 2322  Assess: MEWS Score  Temp (!) 102.5 F (39.2 C)  BP 109/63  Pulse Rate (!) 125  ECG Heart Rate (!) 125  Resp (!) 22  Level of Consciousness Alert  SpO2 99 %  O2 Device Room Air  Assess: MEWS Score  MEWS Temp 2  MEWS Systolic 0  MEWS Pulse 2  MEWS RR 1  MEWS LOC 0  MEWS Score 5  MEWS Score Color Red  Assess: if the MEWS score is Yellow or Red  Were vital signs taken at a resting state? Yes  Focused Assessment No change from prior assessment  Early Detection of Sepsis Score *See Row Information* High  MEWS guidelines implemented *See Row Information* Yes  Treat  MEWS Interventions Administered scheduled meds/treatments;Administered prn meds/treatments (patient admitted for sepsis and md aware)  Take Vital Signs  Increase Vital Sign Frequency  Red: Q 1hr X 4 then Q 4hr X 4, if remains red, continue Q 4hrs  Escalate  MEWS: Escalate Red: discuss with charge nurse/RN and provider, consider discussing with RRT  Notify: Charge Nurse/RN  Name of Charge Nurse/RN Notified April  Date Charge Nurse/RN Notified 07/21/21  Time Charge Nurse/RN Notified 2322  Notify: Provider  Provider Name/Title [email or web address].Chen  Document  Patient Outcome Not stable and remains on department  Progress note created (see row info) Yes  Patient admitted for sepsis. Md aware. Patient medicated for symptoms. Patient currently on antibiotics

## 2021-07-21 NOTE — TOC Initial Note (Signed)
Transition of Care [facility]) - Initial/Assessment Note    Patient Details  Name: Wendy Oconnell MRN: [id number] Date of Birth: 1986/02/20  Transition of Care [facility]) CM/SW Contact:    Harriet Masson, RN Phone Number: 07/21/2021, 2:20 PM  Clinical Narrative:            Spoke to patient about PCP. She states she does have a PCP, Dr. Cheri Rous but he is out of network. I recommend  her call her insurance agency to find a PCP in network if desired.  TOC will continue to follow.     Expected Discharge Plan: Home/Self Care Barriers to Discharge: Continued Medical Work up   Patient Goals and CMS Choice     Return home  Expected Discharge Plan and Services Expected Discharge Plan: Home/Self Care       Living arrangements for the past 2 months: Single Family Home                                      Prior Living Arrangements/Services Living arrangements for the past 2 months: Single Family Home Lives with:: Spouse   Do you feel safe going back to the place where you live?: Yes      Need for Family Participation in Patient Care: No (Comment) Care giver support system in place?: No (comment)   Criminal Activity/Legal Involvement Pertinent to Current Situation/Hospitalization: No - Comment as needed  Activities of Daily Living Home Assistive Devices/Equipment: Eyeglasses ADL Screening (condition at time of admission) Patient's cognitive ability adequate to safely complete daily activities?: Yes Is the patient deaf or have difficulty hearing?: No Does the patient have difficulty seeing, even when wearing glasses/contacts?: No Does the patient have difficulty concentrating, remembering, or making decisions?: No Patient able to express need for assistance with ADLs?: Yes Does the patient have difficulty dressing or bathing?: No Independently performs ADLs?: Yes (appropriate for developmental age) Communication: Independent Dressing (OT): Independent Grooming:  Independent Feeding: Independent Bathing: Independent Toileting: Independent In/Out Bed: Independent Walks in Home: Independent Does the patient have difficulty walking or climbing stairs?: No Weakness of Legs: None Weakness of Arms/Hands: None  Permission Sought/Granted                  Emotional Assessment Appearance:: Appears older than stated age Attitude/Demeanor/Rapport: Engaged Affect (typically observed): Accepting Orientation: : Oriented to Self, Oriented to  Time, Oriented to Place, Oriented to Situation Alcohol / Substance Use: Not Applicable Psych Involvement: No (comment)  Admission diagnosis:  Pelvic pain [R10.2] Abdominal pain [R10.9] Severe sepsis (HCC) [A41.9, R65.20] Sepsis, due to unspecified organism, unspecified whether acute organ dysfunction present [facility]) [A41.9] Patient Active Problem List   Diagnosis Date Noted   Severe sepsis (HCC) 07/20/2021   PCP:  Nonnie Done., MD Pharmacy:  No Pharmacies Listed    Social Determinants of Health (SDOH) Interventions    Readmission Risk Interventions No flowsheet data found.

## 2021-07-21 NOTE — Plan of Care (Signed)
  Problem: Education: Goal: Knowledge of General Education information will improve Description Including pain rating scale, medication(s)/side effects and non-pharmacologic comfort measures Outcome: Progressing   Problem: Health Behavior/Discharge Planning: Goal: Ability to manage health-related needs will improve Outcome: Progressing   

## 2021-07-21 NOTE — Progress Notes (Addendum)
PROGRESS NOTE        PATIENT DETAILS Name: Wendy Oconnell Age: 35 y.o. Sex: female Date of Birth: June 15, 1986 Admit Date: 07/19/2021 Admitting Physician Pratik Darleen Crocker, DO [id number], No Pcp Per (Inactive)  Brief Narrative: Patient is a 35 y.o. female with history of ADHD-presented with sepsis due to right-sided pyelonephritis.  Subjective: Feels much better-still febrile-continues to have mild right CVA tenderness.  Objective: Vitals: Blood pressure 99/62, pulse (!) 109, temperature 98.6 F (37 C), temperature source Oral, resp. rate 20, height 5\' 2"  (1.575 m), weight 62.1 kg, last menstrual period 07/08/2021, SpO2 96 %, unknown if currently breastfeeding.   Exam: Gen Exam:Alert awake-not in any distress HEENT:atraumatic, normocephalic Chest: B/L clear to auscultation anteriorly CVS:S1S2 regular Abdomen:soft non tender, non distended.  Positive right CVA tenderness. Extremities:no edema Neurology: Non focal Skin: no rash  Pertinent Labs/Radiology: Recent Labs  Lab 07/19/21 2203 07/20/21 2251 07/21/21 0056  WBC 31.8*   < > 21.8*  HGB 10.4*   < > 8.3*  PLT 299   < > 307  NA 127*   < > 130*  K 2.5*   < > 3.2*  CREATININE 3.95*   < > 1.04*  AST 25  --   --   ALT 16  --   --   ALKPHOS 94  --   --   BILITOT 1.1  --   --    < > = values in this interval not displayed.    11/15>>Blood culture: No growth 11/14>>Urine Culture: Pending  11/15>> CT: Asymmetric fat stranding around right kidney-concerning for infection.  Assessment/Plan: Sepsis due to right-sided pyelonephritis: Sepsis physiology improving-still febrile but clinically improved-leukocytosis downtrending.  Continue Rocephin and await culture data.  Hypokalemia: Replete and recheck  Hyponatremia: Mild-due to sepsis/dehydration-should improve with supportive care-she is asymptomatic.  AKI: Hemodynamically mediated-improved with supportive care.  Has proteinuria-which is likely due  to pyelonephritis-but will need to follow-up in a few weeks to ensure that it has cleared.  Minimally elevated beta-hCG: Plan is to repeat serum beta-hCG tomorrow-her last menstrual cycle was earlier this month.  Pelvic ultrasound was negative for products of conception  ADHD: On Adderall  Microcytic anemia: Probably due to menstrual loss-worsened by acute illness/IVF dilution.  No indication of acute blood loss.  Hopefully hemoglobin will improve with just supportive care-watch closely.  BMI Estimated body mass index is 25.04 kg/m as calculated from the following:   Height as of this encounter: 5\' 2"  (1.575 m).   Weight as of this encounter: 62.1 kg.    Procedures: None Consults: None DVT Prophylaxis: SQ Heparin Code Status:Full code Family Communication: Spouse at bedside  Time spent: 11 minutes-Greater than 50% of this time was spent in counseling, explanation of diagnosis, planning of further management, and coordination of care.   Disposition Plan: Status is: Inpatient  Remains inpatient appropriate because: Sepsis due to pyelonephritis-not yet stable for discharge-cultures pending.  Still with significant leukocytosis.    Diet: Diet Order             Diet regular Room service appropriate? Yes; Fluid consistency: Thin  Diet effective now                     Antimicrobial agents: Anti-infectives (From admission, onward)    Start     Dose/Rate Route Frequency Ordered Stop  07/21/21 0400  cefTRIAXone (ROCEPHIN) 2 g in sodium chloride 0.9 % 100 mL IVPB        2 g 200 mL/hr over 30 Minutes Intravenous Every 24 hours 07/20/21 0855     07/20/21 0330  cefTRIAXone (ROCEPHIN) 2 g in sodium chloride 0.9 % 100 mL IVPB        2 g 200 mL/hr over 30 Minutes Intravenous  Once 07/20/21 0317 07/20/21 0446        MEDICATIONS: Scheduled Meds:  amphetamine-dextroamphetamine  30 mg Oral Daily   heparin  5,000 Units Subcutaneous Q8H   influenza vac split quadrivalent PF   0.5 mL Intramuscular Tomorrow-1000   potassium chloride  40 mEq Oral Q4H   Continuous Infusions:  sodium chloride 100 mL/hr at 07/21/21 0534   cefTRIAXone (ROCEPHIN)  IV 2 g (07/21/21 0535)   PRN Meds:.acetaminophen **OR** acetaminophen, HYDROmorphone (DILAUDID) injection, ondansetron **OR** ondansetron (ZOFRAN) IV   I have personally reviewed following labs and imaging studies  LABORATORY DATA: CBC: Recent Labs  Lab 07/19/21 2203 07/20/21 2335 07/21/21 0056  WBC 31.8* 23.6* 21.8*  NEUTROABS 27.7*  --   --   HGB 10.4* 8.3* 8.3*  HCT 31.4* 24.4* 23.2*  MCV 72.9* 71.1* 70.5*  PLT 299 324 307    Basic Metabolic Panel: Recent Labs  Lab 07/19/21 2203 07/20/21 2251 07/21/21 0056  NA 127* 131* 130*  K 2.5* 3.4* 3.2*  CL 96* 107 106  CO2 19* 16* 16*  GLUCOSE 149* 158* 131*  BUN 34* 13 12  CREATININE 3.95* 1.10* 1.04*  CALCIUM 8.5* 7.6* 7.6*  MG  --  2.0 2.1    GFR: Estimated Creatinine Clearance: 65.4 mL/min (A) (by C-G formula based on SCr of 1.04 mg/dL (H)).  Liver Function Tests: Recent Labs  Lab 07/19/21 2203  AST 25  ALT 16  ALKPHOS 94  BILITOT 1.1  PROT 7.6  ALBUMIN 3.0*   No results for input(s): LIPASE, AMYLASE in the last 168 hours. No results for input(s): AMMONIA in the last 168 hours.  Coagulation Profile: No results for input(s): INR, PROTIME in the last 168 hours.  Cardiac Enzymes: Recent Labs  Lab 07/19/21 2203  CKTOTAL 13*    BNP (last 3 results) No results for input(s): PROBNP in the last 8760 hours.  Lipid Profile: No results for input(s): CHOL, HDL, LDLCALC, TRIG, CHOLHDL, LDLDIRECT in the last 72 hours.  Thyroid Function Tests: No results for input(s): TSH, T4TOTAL, FREET4, T3FREE, THYROIDAB in the last 72 hours.  Anemia Panel: Recent Labs    07/20/21 1326 07/20/21 1900  VITAMINB12  --  1,237*  FOLATE 9.9  --   FERRITIN  --  783*  TIBC  --  207*  IRON  --  14*  RETICCTPCT 0.7  --     Urine analysis:     Component Value Date/Time   COLORURINE YELLOW 07/19/2021 2153   APPEARANCEUR TURBID (A) 07/19/2021 2153   LABSPEC 1.025 07/19/2021 2153   PHURINE 5.0 07/19/2021 2153   GLUCOSEU 100 (A) 07/19/2021 2153   HGBUR MODERATE (A) 07/19/2021 2153   BILIRUBINUR SMALL (A) 07/19/2021 2153   KETONESUR NEGATIVE 07/19/2021 2153   PROTEINUR >300 (A) 07/19/2021 2153   NITRITE NEGATIVE 07/19/2021 2153   LEUKOCYTESUR MODERATE (A) 07/19/2021 2153    Sepsis Labs: Lactic Acid, Venous    Component Value Date/Time   LATICACIDVEN 1.8 07/20/2021 0345    MICROBIOLOGY: Recent Results (from the past 240 hour(s))  Novel Coronavirus, NAA ([facility])  Status: None   Collection Time: 07/18/21  2:21 PM   Specimen: Nasopharyngeal Swab; Nasopharyngeal(NP) swabs in vial transport medium   Nasopharynge  Release  Result Value Ref Range Status   SARS-CoV-2, NAA Not Detected Not Detected Final    Comment: This nucleic acid amplification test was developed and its performance characteristics determined by [employer]. Nucleic acid amplification tests include RT-PCR and TMA. This test has not been FDA cleared or approved. This test has been authorized by FDA under an Emergency Use Authorization (EUA). This test is only authorized for the duration of time the declaration that circumstances exist justifying the authorization of the emergency use of in vitro diagnostic tests for detection of SARS-CoV-2 virus and/or diagnosis of COVID-19 infection under section 564(b)(1) of the Act, 21 U.S.C. [id number]) (1), unless the authorization is terminated or revoked sooner. When diagnostic testing is negative, the possibility of a false negative result should be considered in the context of a patient's recent exposures and the presence of clinical signs and symptoms consistent with COVID-19. An individual without symptoms of COVID-19 and who is not shedding SARS-CoV-2 virus wo uld expect to have a negative (not  detected) result in this assay.   SARS-COV-2, NAA 2 DAY TAT     Status: None   Collection Time: 07/18/21  2:21 PM   Nasopharynge  Release  Result Value Ref Range Status   SARS-CoV-2, NAA 2 DAY TAT Performed  Final  Resp Panel by RT-PCR (Flu A&B, Covid) Nasopharyngeal Swab     Status: None   Collection Time: 07/19/21  9:54 PM   Specimen: Nasopharyngeal Swab; Nasopharyngeal(NP) swabs in vial transport medium  Result Value Ref Range Status   SARS Coronavirus 2 by RT PCR NEGATIVE NEGATIVE Final    Comment: (NOTE) SARS-CoV-2 target nucleic acids are NOT DETECTED.  The SARS-CoV-2 RNA is generally detectable in upper respiratory specimens during the acute phase of infection. The lowest concentration of SARS-CoV-2 viral copies this assay can detect is 138 copies/mL. A negative result does not preclude SARS-Cov-2 infection and should not be used as the sole basis for treatment or other patient management decisions. A negative result may occur with  improper specimen collection/handling, submission of specimen other than nasopharyngeal swab, presence of viral mutation(s) within the areas targeted by this assay, and inadequate number of viral copies(<138 copies/mL). A negative result must be combined with clinical observations, patient history, and epidemiological information. The expected result is Negative.  Fact Sheet for Patients:  EntrepreneurPulse.com.au  Fact Sheet for Healthcare Providers:  IncredibleEmployment.be  This test is no t yet approved or cleared by the [place] FDA and  has been authorized for detection and/or diagnosis of SARS-CoV-2 by FDA under an Emergency Use Authorization (EUA). This EUA will remain  in effect (meaning this test can be used) for the duration of the COVID-19 declaration under Section 564(b)(1) of the Act, 21 U.S.C.section 360bbb-3(b)(1), unless the authorization is terminated  or revoked sooner.        Influenza A by PCR NEGATIVE NEGATIVE Final   Influenza B by PCR NEGATIVE NEGATIVE Final    Comment: (NOTE) The Xpert Xpress SARS-CoV-2/FLU/RSV plus assay is intended as an aid in the diagnosis of influenza from Nasopharyngeal swab specimens and should not be used as a sole basis for treatment. Nasal washings and aspirates are unacceptable for Xpert Xpress SARS-CoV-2/FLU/RSV testing.  Fact Sheet for Patients: EntrepreneurPulse.com.au  Fact Sheet for Healthcare Providers: IncredibleEmployment.be  This test is not yet approved or cleared  by the [place] and has been authorized for detection and/or diagnosis of SARS-CoV-2 by FDA under an Emergency Use Authorization (EUA). This EUA will remain in effect (meaning this test can be used) for the duration of the COVID-19 declaration under Section 564(b)(1) of the Act, 21 U.S.C. section 360bbb-3(b)(1), unless the authorization is terminated or revoked.  Performed at [facility], [address], [place], [place] [address]   Blood Culture (routine x 2)     Status: None (Preliminary result)   Collection Time: 07/20/21  3:45 AM   Specimen: BLOOD  Result Value Ref Range Status   Specimen Description BLOOD SITE NOT SPECIFIED  Final   Special Requests   Final    BOTTLES DRAWN AEROBIC AND ANAEROBIC Blood Culture adequate volume   Culture   Final    NO GROWTH 1 DAY Performed at [facility], [address] [place], [place] [address]    Report Status PENDING  Incomplete  Blood Culture (routine x 2)     Status: None (Preliminary result)   Collection Time: 07/20/21  1:26 PM   Specimen: BLOOD  Result Value Ref Range Status   Specimen Description BLOOD  Final   Special Requests   Final    BOTTLES DRAWN AEROBIC AND ANAEROBIC Blood Culture results may not be optimal due to an inadequate volume of blood received in culture bottles   Culture   Final    NO GROWTH < 24 HOURS Performed at  [facility], [address], [place], [place] [address]    Report Status PENDING  Incomplete    RADIOLOGY STUDIES/RESULTS: CT ABDOMEN PELVIS WO CONTRAST  Result Date: 07/20/2021 CLINICAL DATA:  Concern for infection EXAM: CT ABDOMEN AND PELVIS WITHOUT CONTRAST TECHNIQUE: Multidetector CT imaging of the abdomen and pelvis was performed following the standard protocol without IV contrast. COMPARISON:  None. FINDINGS: Lower chest: No acute abnormality. Hepatobiliary: No focal liver abnormality is seen. No gallstones, gallbladder wall thickening, or biliary dilatation. Pancreas: Unremarkable. No pancreatic ductal dilatation or surrounding inflammatory changes. Spleen: Normal in size without focal abnormality. Adrenals/Urinary Tract: Bilateral adrenal glands are unremarkable. Mild asymmetric fat stranding about the right kidney. No hydronephrosis or nephrolithiasis. Bladder is unremarkable. Stomach/Bowel: Stomach is within normal limits. Appendix is not visualized. No evidence of bowel wall thickening, distention, or inflammatory changes. Vascular/Lymphatic: No significant vascular findings are present. No enlarged abdominal or pelvic lymph nodes. Reproductive: Uterus and bilateral adnexa are unremarkable. Other: No abdominal wall hernia or abnormality. No abdominopelvic ascites. Musculoskeletal: No acute or significant osseous findings. IMPRESSION: Mild asymmetric fat stranding about the right kidney, concerning for infection. Correlate with urinalysis. Electronically Signed   By: Allegra Lai M.D.   On: 07/20/2021 08:05   DG Chest 2 View  Result Date: 07/19/2021 CLINICAL DATA:  Fever EXAM: CHEST - 2 VIEW COMPARISON:  10/21/2017 FINDINGS: The heart size and mediastinal contours are within normal limits. Both lungs are clear. The visualized skeletal structures are unremarkable. IMPRESSION: No active cardiopulmonary disease. Electronically Signed   By: Charlett Nose M.D.   On: 07/19/2021 22:32    US Renal  Result Date: 07/20/2021 CLINICAL DATA:  35 year old female with flank and pelvic pain. EXAM: RENAL / URINARY TRACT ULTRASOUND COMPLETE COMPARISON:  CT Abdomen and Pelvis 09/06/2008. FINDINGS: Right Kidney: Renal measurements: 12.4 x 5.8 x 6.3 cm = volume: 240 mL. Cortical echogenicity and corticomedullary differentiation are within normal limits. No right hydronephrosis or renal lesion. Left Kidney: Renal  measurements: 11.8 x 5.8 x 5.8 cm = volume: 206 mL. Echogenicity within normal limits. No mass or hydronephrosis visualized. Bladder: Appears normal for degree of bladder distention. Bladder volume estimated at 117 mL. Both ureteral jets detected with Doppler. Other: None. IMPRESSION: Ultrasound appearance of both kidneys and bladder within normal limits. Electronically Signed   By: Genevie Ann M.D.   On: 07/20/2021 05:37   US OB LESS THAN 14 WEEKS W/ OB TRANSVAGINAL AND DOPPLER  Result Date: 07/20/2021 CLINICAL DATA:  35 year old female with flank and pelvic pain. Quantitative beta HCG = 5. EXAM: TRANSABDOMINAL AND TRANSVAGINAL ULTRASOUND OF PELVIS DOPPLER ULTRASOUND OF OVARIES TECHNIQUE: Both transabdominal and transvaginal ultrasound examinations of the pelvis were performed. Transabdominal technique was performed for global imaging of the pelvis including uterus, ovaries, adnexal regions, and pelvic cul-de-sac. It was necessary to proceed with endovaginal exam following the transabdominal exam to visualize the endometrium, ovaries. Color and duplex Doppler ultrasound was utilized to evaluate blood flow to the ovaries. COMPARISON:  Ob ultrasound 05/18/2009. CT Abdomen and Pelvis 09/06/2008. FINDINGS: Uterus Measurements: 9.8 x 5.6 x 6.6 cm. No fibroids or other mass visualized. Endometrium Thickness: 8 mm.  No gestational sac identified. Right ovary Measurements: 3.1 x 1.8 x 2.2 cm = volume: 7 mL. Normal appearance/no adnexal mass, multiple small follicles (image 35). Left ovary Measurements:  2.7 x 1.6 x 3.3 cm = volume: 7 mL. Normal appearance/no adnexal mass. Pulsed Doppler evaluation of both ovaries demonstrates normal low-resistance arterial and venous waveforms. Other findings Small volume of mildly complex free fluid in the cul-de-sac. IMPRESSION: 1. No IUP identified, and no adnexal mass. Recommend serial quantitative beta HCG, and repeat Ultrasound as necessary. 2. No evidence of ovarian torsion. A small volume of mildly complex free fluid in the cul-de-sac is nonspecific. Electronically Signed   By: Genevie Ann M.D.   On: 07/20/2021 05:41     LOS: 1 day   Oren Binet, MD  [facility]    To contact the attending provider between 7A-7P or the covering provider during after hours 7P-7A, please log into the web site [email or web address] and access using universal [facility] password for that web site. If you do not have the password, please call the hospital operator.  07/21/2021, 11:39 AM

## 2021-07-22 ENCOUNTER — Other Ambulatory Visit (HOSPITAL_COMMUNITY): Payer: Self-pay

## 2021-07-22 DIAGNOSIS — N179 Acute kidney failure, unspecified: Secondary | ICD-10-CM

## 2021-07-22 LAB — MAGNESIUM: Magnesium: 1.7 mg/dL (ref 1.7–2.4)

## 2021-07-22 LAB — BASIC METABOLIC PANEL
Anion gap: 6 (ref 5–15)
BUN: 5 mg/dL — ABNORMAL LOW (ref 6–20)
CO2: 22 mmol/L (ref 22–32)
Calcium: 7.6 mg/dL — ABNORMAL LOW (ref 8.9–10.3)
Chloride: 102 mmol/L (ref 98–111)
Creatinine, Ser: 0.61 mg/dL (ref 0.44–1.00)
GFR, Estimated: >60 mL/min (ref >60)
Glucose, Bld: 103 mg/dL — ABNORMAL HIGH (ref 70–99)
Potassium: 3.6 mmol/L (ref 3.5–5.1)
Sodium: 130 mmol/L — ABNORMAL LOW (ref 135–145)

## 2021-07-22 LAB — CBC
HCT: 22 % — ABNORMAL LOW (ref 36.0–46.0)
Hemoglobin: 7.8 g/dL — ABNORMAL LOW (ref 12.0–15.0)
MCH: 24.9 pg — ABNORMAL LOW (ref 26.0–34.0)
MCHC: 35.5 g/dL (ref 30.0–36.0)
MCV: 70.3 fL — ABNORMAL LOW (ref 80.0–100.0)
Platelets: 376 10*3/uL (ref 150–400)
RBC: 3.13 MIL/uL — ABNORMAL LOW (ref 3.87–5.11)
RDW: 13.2 % (ref 11.5–15.5)
WBC: 10.8 10*3/uL — ABNORMAL HIGH (ref 4.0–10.5)
nRBC: 0 % (ref 0.0–0.2)

## 2021-07-22 LAB — HCG, QUANTITATIVE, PREGNANCY: hCG, Beta Chain, Quant, S: 1 m[IU]/mL (ref <5)

## 2021-07-22 MED ORDER — ACETAMINOPHEN 500 MG PO TABS: 1000.0000 mg | ORAL_TABLET | Freq: Three times a day (TID) | ORAL | 0 refills | Status: AC | PRN

## 2021-07-22 MED ORDER — KETOROLAC TROMETHAMINE 30 MG/ML IJ SOLN
30.0000 mg | Freq: Three times a day (TID) | INTRAMUSCULAR | Status: DC | PRN
  Administered 2021-07-22: 09:00:00 30 mg via INTRAVENOUS
  Filled 2021-07-22 (×2): qty 1

## 2021-07-22 MED ORDER — METOCLOPRAMIDE HCL 5 MG/ML IJ SOLN
5.0000 mg | Freq: Three times a day (TID) | INTRAMUSCULAR | Status: DC | PRN
  Administered 2021-07-22: 09:00:00 5 mg via INTRAVENOUS
  Filled 2021-07-22: qty 2

## 2021-07-22 MED ORDER — FERROUS SULFATE 325 (65 FE) MG PO TABS: 325.0000 mg | ORAL_TABLET | Freq: Two times a day (BID) | ORAL | Status: DC

## 2021-07-22 MED ORDER — FERROUS SULFATE 325 (65 FE) MG PO TABS
325.0000 mg | ORAL_TABLET | Freq: Two times a day (BID) | ORAL | 0 refills | Status: AC
  Filled 2021-07-22: qty 30, 15d supply, fill #0

## 2021-07-22 MED ORDER — CEFDINIR 300 MG PO CAPS
300.0000 mg | ORAL_CAPSULE | Freq: Two times a day (BID) | ORAL | 0 refills | Status: AC
  Filled 2021-07-22: qty 8, 4d supply, fill #0

## 2021-07-22 MED ORDER — IBUPROFEN 200 MG PO TABS: 600.0000 mg | ORAL_TABLET | Freq: Three times a day (TID) | ORAL | 0 refills | Status: AC | PRN

## 2021-07-22 MED ORDER — MAGNESIUM SULFATE 2 GM/50ML IV SOLN
2.0000 g | Freq: Once | INTRAVENOUS | Status: AC
  Administered 2021-07-22: 12:00:00 2 g via INTRAVENOUS
  Filled 2021-07-22: qty 50

## 2021-07-22 MED ORDER — DIPHENHYDRAMINE HCL 50 MG/ML IJ SOLN
12.5000 mg | Freq: Three times a day (TID) | INTRAMUSCULAR | Status: DC | PRN
  Administered 2021-07-22: 09:00:00 12.5 mg via INTRAVENOUS
  Filled 2021-07-22: qty 1

## 2021-07-22 NOTE — Progress Notes (Signed)
Discussed d/c instructions with pt, pt verbalize understanding, meds delivered from pharmacy. No distress at time of d/c.

## 2021-07-22 NOTE — Discharge Summary (Signed)
PATIENT DETAILS Name: Wendy Oconnell Age: 35 y.o. Sex: female Date of Birth: 1985-09-16 MRN: [id number]. Admitting Physician: Rodena Goldmann, DO [id number], Marshall Cork., MD  Admit Date: 07/19/2021 Discharge date: 07/22/2021  Recommendations for Outpatient Follow-up:  Follow up with PCP in 1-2 weeks Please obtain CMP/CBC in one week Please follow up on final urine culture results Has proteinuria-please repeat UA when acute illness has resolved.  Admitted From:  Home  Disposition: [place]: No  Equipment/Devices: None  Discharge Condition: Stable  CODE STATUS: FULL CODE  Diet recommendation:  Diet Order             Diet general           Diet regular Room service appropriate? Yes; Fluid consistency: Thin  Diet effective now                    Brief Summary: Patient is a 35 y.o. female with history of ADHD-presented with sepsis due to right-sided pyelonephritis.  Pertinent Labs/Radiology: 11/15>>Blood culture: No growth 11/14>>Urine Culture: Gram-negative rods. 11/15>> CT: Asymmetric fat stranding around right kidney-concerning for infection.  Brief Hospital Course: Sepsis due to right-sided pyelonephritis: Sepsis physiology has resolved-patient is significantly better-continues to have some mild right CVA tenderness-she is now afebrile.  Leukocytosis has normalized.  She has responded well to Rocephin-urine cultures are still pending but preliminary data shows gram-negative rods.  Since she has responded well to Rocephin-and feels significantly better and is easily tolerating oral intake-suspect she can be switched to oral antimicrobial therapy to complete at least 1 week of treatment.    Hypokalemia: Repleted.   Hyponatremia: Mild-due to sepsis/dehydration-should improve with supportive care-she is asymptomatic.   AKI: Hemodynamically mediated-improved with supportive care.  Has proteinuria-which is likely due to pyelonephritis-but will need to  follow-up in a few weeks to ensure that it has cleared.   Minimally elevated beta-hCG: Repeat beta-hCG is downtrending-do not think she requires any further work-up.  Pelvic ultrasound was negative for any products of contraception.  Her last menstrual period was earlier this month.    ADHD: On Adderall   Microcytic anemia: Probably due to menstrual loss-worsened by acute illness/IVF dilution.  No indication of acute blood loss.  Will place patient on iron supplementation-suspect that hemoglobin will improve with just supportive care-repeat CBC in 1 week at PCPs office.     BMI Estimated body mass index is 25.04 kg/m as calculated from the following:   Height as of this encounter: 5\' 2"  (1.575 m).   Weight as of this encounter: 62.1 kg.    Procedures None  Discharge Diagnoses:  Active Problems:   Severe sepsis [facility])   Discharge Instructions:  Activity:  As tolerated  Discharge Instructions     Call MD for:  persistant nausea and vomiting   Complete by: As directed    Call MD for:  severe uncontrolled pain   Complete by: As directed    Call MD for:  temperature >100.4   Complete by: As directed    Diet general   Complete by: As directed    Discharge instructions   Complete by: As directed    Follow with Primary MD  Enid Skeens., MD in 1-2 weeks  Please get a complete blood count and chemistry panel checked by your Primary MD at your next visit, and again as instructed by your Primary MD.  Get Medicines reviewed and adjusted: Please take all your medications with you for your  next visit with your Primary MD  Laboratory/radiological data: Please request your Primary MD to go over all hospital tests and procedure/radiological results at the follow up, please ask your Primary MD to get all Hospital records sent to his/her office.  In some cases, they will be blood work, cultures and biopsy results pending at the time of your discharge. Please request that your  primary care M.D. follows up on these results.  Also Note the following: If you experience worsening of your admission symptoms, develop shortness of breath, life threatening emergency, suicidal or homicidal thoughts you must seek medical attention immediately by calling 911 or calling your MD immediately  if symptoms less severe.  You must read complete instructions/literature along with all the possible adverse reactions/side effects for all the Medicines you take and that have been prescribed to you. Take any new Medicines after you have completely understood and accpet all the possible adverse reactions/side effects.   Do not drive when taking Pain medications or sleeping medications (Benzodaizepines)  Do not take more than prescribed Pain, Sleep and Anxiety Medications. It is not advisable to combine anxiety,sleep and pain medications without talking with your primary care practitioner  Special Instructions: If you have smoked or chewed Tobacco  in the last 2 yrs please stop smoking, stop any regular Alcohol  and or any Recreational drug use.  Wear Seat belts while driving.  Please note: You were cared for by a hospitalist during your hospital stay. Once you are discharged, your primary care physician will handle any further medical issues. Please note that NO REFILLS for any discharge medications will be authorized once you are discharged, as it is imperative that you return to your primary care physician (or establish a relationship with a primary care physician if you do not have one) for your post hospital discharge needs so that they can reassess your need for medications and monitor your lab values.   1.  Please ask your primary care practitioner to repeat CBC/chemistry panel in 1 week  2.  Please ask your primary care practitioner to repeat the urine analysis-you had some protein in your urine which is likely from the infection.  Once your infection has been treated-and if you still  have protein in your urine-you may require to be seen by a nephrologist.  3.  Please ask your primary care practitioner to follow urine/blood cultures until final.   Increase activity slowly   Complete by: As directed       Allergies as of 07/22/2021   No Known Allergies      Medication List     STOP taking these medications    benzonatate 100 MG capsule Commonly known as: TESSALON   cetirizine 10 MG tablet Commonly known as: ZyrTEC Allergy   fluticasone 50 MCG/ACT nasal spray Commonly known as: FLONASE   naproxen sodium 220 MG tablet Commonly known as: ALEVE       TAKE these medications    acetaminophen 500 MG tablet Commonly known as: TYLENOL Take 2 tablets (1,000 mg total) by mouth every 8 (eight) hours as needed for moderate pain or headache. What changed: when to take this   Adderall XR 30 MG 24 hr capsule Generic drug: amphetamine-dextroamphetamine Take 30 mg by mouth daily.   cefdinir 300 MG capsule Commonly known as: OMNICEF Take 1 capsule (300 mg total) by mouth 2 (two) times daily for 4 days. Start taking on: July 23, 2021   ferrous sulfate 325 (65 FE) MG tablet  Take 1 tablet (325 mg total) by mouth 2 (two) times daily with a meal.   ibuprofen 200 MG tablet Commonly known as: ADVIL Take 3 tablets (600 mg total) by mouth every 8 (eight) hours as needed for headache or moderate pain. What changed: when to take this        Follow-up Information     Slatosky, Excell Seltzer., MD. Schedule an appointment as soon as possible for a visit in 1 week(s).   Specialty: Family Medicine Contact information: [address] [place] [address] [phone]                No Known Allergies    Consultations: None   Other Procedures/Studies: CT ABDOMEN PELVIS WO CONTRAST  Result Date: 07/20/2021 CLINICAL DATA:  Concern for infection EXAM: CT ABDOMEN AND PELVIS WITHOUT CONTRAST TECHNIQUE: Multidetector CT imaging of the abdomen and pelvis  was performed following the standard protocol without IV contrast. COMPARISON:  None. FINDINGS: Lower chest: No acute abnormality. Hepatobiliary: No focal liver abnormality is seen. No gallstones, gallbladder wall thickening, or biliary dilatation. Pancreas: Unremarkable. No pancreatic ductal dilatation or surrounding inflammatory changes. Spleen: Normal in size without focal abnormality. Adrenals/Urinary Tract: Bilateral adrenal glands are unremarkable. Mild asymmetric fat stranding about the right kidney. No hydronephrosis or nephrolithiasis. Bladder is unremarkable. Stomach/Bowel: Stomach is within normal limits. Appendix is not visualized. No evidence of bowel wall thickening, distention, or inflammatory changes. Vascular/Lymphatic: No significant vascular findings are present. No enlarged abdominal or pelvic lymph nodes. Reproductive: Uterus and bilateral adnexa are unremarkable. Other: No abdominal wall hernia or abnormality. No abdominopelvic ascites. Musculoskeletal: No acute or significant osseous findings. IMPRESSION: Mild asymmetric fat stranding about the right kidney, concerning for infection. Correlate with urinalysis. Electronically Signed   By: Allegra Lai M.D.   On: 07/20/2021 08:05   DG Chest 2 View  Result Date: 07/19/2021 CLINICAL DATA:  Fever EXAM: CHEST - 2 VIEW COMPARISON:  10/21/2017 FINDINGS: The heart size and mediastinal contours are within normal limits. Both lungs are clear. The visualized skeletal structures are unremarkable. IMPRESSION: No active cardiopulmonary disease. Electronically Signed   By: Charlett Nose M.D.   On: 07/19/2021 22:32   US Renal  Result Date: 07/20/2021 CLINICAL DATA:  35 year old female with flank and pelvic pain. EXAM: RENAL / URINARY TRACT ULTRASOUND COMPLETE COMPARISON:  CT Abdomen and Pelvis 09/06/2008. FINDINGS: Right Kidney: Renal measurements: 12.4 x 5.8 x 6.3 cm = volume: 240 mL. Cortical echogenicity and corticomedullary differentiation are  within normal limits. No right hydronephrosis or renal lesion. Left Kidney: Renal measurements: 11.8 x 5.8 x 5.8 cm = volume: 206 mL. Echogenicity within normal limits. No mass or hydronephrosis visualized. Bladder: Appears normal for degree of bladder distention. Bladder volume estimated at 117 mL. Both ureteral jets detected with Doppler. Other: None. IMPRESSION: Ultrasound appearance of both kidneys and bladder within normal limits. Electronically Signed   By: Odessa Fleming M.D.   On: 07/20/2021 05:37   US OB LESS THAN 14 WEEKS W/ OB TRANSVAGINAL AND DOPPLER  Result Date: 07/20/2021 CLINICAL DATA:  35 year old female with flank and pelvic pain. Quantitative beta HCG = 5. EXAM: TRANSABDOMINAL AND TRANSVAGINAL ULTRASOUND OF PELVIS DOPPLER ULTRASOUND OF OVARIES TECHNIQUE: Both transabdominal and transvaginal ultrasound examinations of the pelvis were performed. Transabdominal technique was performed for global imaging of the pelvis including uterus, ovaries, adnexal regions, and pelvic cul-de-sac. It was necessary to proceed with endovaginal exam following the transabdominal exam to visualize the endometrium, ovaries. Color and  duplex Doppler ultrasound was utilized to evaluate blood flow to the ovaries. COMPARISON:  Ob ultrasound 05/18/2009. CT Abdomen and Pelvis 09/06/2008. FINDINGS: Uterus Measurements: 9.8 x 5.6 x 6.6 cm. No fibroids or other mass visualized. Endometrium Thickness: 8 mm.  No gestational sac identified. Right ovary Measurements: 3.1 x 1.8 x 2.2 cm = volume: 7 mL. Normal appearance/no adnexal mass, multiple small follicles (image 35). Left ovary Measurements: 2.7 x 1.6 x 3.3 cm = volume: 7 mL. Normal appearance/no adnexal mass. Pulsed Doppler evaluation of both ovaries demonstrates normal low-resistance arterial and venous waveforms. Other findings Small volume of mildly complex free fluid in the cul-de-sac. IMPRESSION: 1. No IUP identified, and no adnexal mass. Recommend serial quantitative beta  HCG, and repeat Ultrasound as necessary. 2. No evidence of ovarian torsion. A small volume of mildly complex free fluid in the cul-de-sac is nonspecific. Electronically Signed   By: Genevie Ann M.D.   On: 07/20/2021 05:41     TODAY-DAY OF DISCHARGE:  Subjective:   Wendy Oconnell today has no headache,no chest abdominal pain,no new weakness tingling or numbness, feels much better wants to go home today.   Objective:   Blood pressure 113/85, pulse 85, temperature 98.7 F (37.1 C), temperature source Oral, resp. rate 20, height 5\' 2"  (1.575 m), weight 62.1 kg, last menstrual period 07/08/2021, SpO2 98 %, unknown if currently breastfeeding.  Intake/Output Summary (Last 24 hours) at 07/22/2021 1105 Last data filed at 07/21/2021 1716 Gross per 24 hour  Intake 2009.93 ml  Output --  Net 2009.93 ml   Filed Weights   07/20/21 2328  Weight: 62.1 kg    Exam: Awake Alert, Oriented *3, No new F.N deficits, Normal affect [place].AT,PERRAL Supple Neck,No JVD, No cervical lymphadenopathy appriciated.  Symmetrical Chest wall movement, Good air movement bilaterally, CTAB RRR,No Gallops,Rubs or new Murmurs, No Parasternal Heave +ve B.Sounds, Abd Soft, Non tender, No organomegaly appriciated, No rebound -guarding or rigidity. No Cyanosis, Clubbing or edema, No new Rash or bruise   PERTINENT RADIOLOGIC STUDIES: No results found.   PERTINENT LAB RESULTS: CBC: Recent Labs    07/21/21 0056 07/22/21 0109  WBC 21.8* 10.8*  HGB 8.3* 7.8*  HCT 23.2* 22.0*  PLT 307 376   CMET CMP     Component Value Date/Time   NA 130 (L) 07/22/2021 0109   K 3.6 07/22/2021 0109   CL 102 07/22/2021 0109   CO2 22 07/22/2021 0109   GLUCOSE 103 (H) 07/22/2021 0109   BUN 5 (L) 07/22/2021 0109   CREATININE 0.61 07/22/2021 0109   CALCIUM 7.6 (L) 07/22/2021 0109   PROT 7.6 07/19/2021 2203   ALBUMIN 3.0 (L) 07/19/2021 2203   AST 25 07/19/2021 2203   ALT 16 07/19/2021 2203   ALKPHOS 94 07/19/2021 2203   BILITOT 1.1  07/19/2021 2203   GFRNONAA >60 07/22/2021 0109    GFR Estimated Creatinine Clearance: 85.1 mL/min (by C-G formula based on SCr of 0.61 mg/dL). No results for input(s): LIPASE, AMYLASE in the last 72 hours. Recent Labs    07/19/21 2203  CKTOTAL 13*   Invalid input(s): POCBNP No results for input(s): DDIMER in the last 72 hours. No results for input(s): HGBA1C in the last 72 hours. No results for input(s): CHOL, HDL, LDLCALC, TRIG, CHOLHDL, LDLDIRECT in the last 72 hours. No results for input(s): TSH, T4TOTAL, T3FREE, THYROIDAB in the last 72 hours.  Invalid input(s): FREET3 Recent Labs    07/20/21 1326 07/20/21 1900  VITAMINB12  --  1,237*  FOLATE 9.9  --   FERRITIN  --  783*  TIBC  --  207*  IRON  --  14*  RETICCTPCT 0.7  --    Coags: No results for input(s): INR in the last 72 hours.  Invalid input(s): PT Microbiology: Recent Results (from the past 240 hour(s))  Novel Coronavirus, NAA ([facility])     Status: None   Collection Time: 07/18/21  2:21 PM   Specimen: Nasopharyngeal Swab; Nasopharyngeal(NP) swabs in vial transport medium   Nasopharynge  Release  Result Value Ref Range Status   SARS-CoV-2, NAA Not Detected Not Detected Final    Comment: This nucleic acid amplification test was developed and its performance characteristics determined by [employer]. Nucleic acid amplification tests include RT-PCR and TMA. This test has not been FDA cleared or approved. This test has been authorized by FDA under an Emergency Use Authorization (EUA). This test is only authorized for the duration of time the declaration that circumstances exist justifying the authorization of the emergency use of in vitro diagnostic tests for detection of SARS-CoV-2 virus and/or diagnosis of COVID-19 infection under section 564(b)(1) of the Act, 21 U.S.C. [id number]) (1), unless the authorization is terminated or revoked sooner. When diagnostic testing is negative, the possibility of  a false negative result should be considered in the context of a patient's recent exposures and the presence of clinical signs and symptoms consistent with COVID-19. An individual without symptoms of COVID-19 and who is not shedding SARS-CoV-2 virus wo uld expect to have a negative (not detected) result in this assay.   SARS-COV-2, NAA 2 DAY TAT     Status: None   Collection Time: 07/18/21  2:21 PM   Nasopharynge  Release  Result Value Ref Range Status   SARS-CoV-2, NAA 2 DAY TAT Performed  Final  Urine Culture     Status: Abnormal (Preliminary result)   Collection Time: 07/19/21  9:53 PM   Specimen: Urine, Clean Catch  Result Value Ref Range Status   Specimen Description URINE, CLEAN CATCH  Final   Special Requests NONE  Final   Culture (A)  Final    >=100,000 COLONIES/mL GRAM NEGATIVE RODS SUSCEPTIBILITIES TO FOLLOW CULTURE REINCUBATED FOR BETTER GROWTH Performed at [facility], [place] [address], [place], [place] [address]    Report Status PENDING  Incomplete  Resp Panel by RT-PCR (Flu A&B, Covid) Nasopharyngeal Swab     Status: None   Collection Time: 07/19/21  9:54 PM   Specimen: Nasopharyngeal Swab; Nasopharyngeal(NP) swabs in vial transport medium  Result Value Ref Range Status   SARS Coronavirus 2 by RT PCR NEGATIVE NEGATIVE Final    Comment: (NOTE) SARS-CoV-2 target nucleic acids are NOT DETECTED.  The SARS-CoV-2 RNA is generally detectable in upper respiratory specimens during the acute phase of infection. The lowest concentration of SARS-CoV-2 viral copies this assay can detect is 138 copies/mL. A negative result does not preclude SARS-Cov-2 infection and should not be used as the sole basis for treatment or other patient management decisions. A negative result may occur with  improper specimen collection/handling, submission of specimen other than nasopharyngeal swab, presence of viral mutation(s) within the areas targeted by this assay, and inadequate number  of viral copies(<138 copies/mL). A negative result must be combined with clinical observations, patient history, and epidemiological information. The expected result is Negative.  Fact Sheet for Patients:  EntrepreneurPulse.com.au  Fact Sheet for Healthcare Providers:  IncredibleEmployment.be  This test is no t yet approved or cleared  by the [place] and  has been authorized for detection and/or diagnosis of SARS-CoV-2 by FDA under an Emergency Use Authorization (EUA). This EUA will remain  in effect (meaning this test can be used) for the duration of the COVID-19 declaration under Section 564(b)(1) of the Act, 21 U.S.C.section 360bbb-3(b)(1), unless the authorization is terminated  or revoked sooner.       Influenza A by PCR NEGATIVE NEGATIVE Final   Influenza B by PCR NEGATIVE NEGATIVE Final    Comment: (NOTE) The Xpert Xpress SARS-CoV-2/FLU/RSV plus assay is intended as an aid in the diagnosis of influenza from Nasopharyngeal swab specimens and should not be used as a sole basis for treatment. Nasal washings and aspirates are unacceptable for Xpert Xpress SARS-CoV-2/FLU/RSV testing.  Fact Sheet for Patients: EntrepreneurPulse.com.au  Fact Sheet for Healthcare Providers: IncredibleEmployment.be  This test is not yet approved or cleared by the [place] FDA and has been authorized for detection and/or diagnosis of SARS-CoV-2 by FDA under an Emergency Use Authorization (EUA). This EUA will remain in effect (meaning this test can be used) for the duration of the COVID-19 declaration under Section 564(b)(1) of the Act, 21 U.S.C. section 360bbb-3(b)(1), unless the authorization is terminated or revoked.  Performed at [facility], [place] [address] [place], [place] [address]   Blood Culture (routine x 2)     Status: None (Preliminary result)   Collection Time: 07/20/21  3:45 AM    Specimen: BLOOD  Result Value Ref Range Status   Specimen Description BLOOD SITE NOT SPECIFIED  Final   Special Requests   Final    BOTTLES DRAWN AEROBIC AND ANAEROBIC Blood Culture adequate volume   Culture   Final    NO GROWTH 1 DAY Performed at [facility], [place] [address] [place], [place] [address]    Report Status PENDING  Incomplete  Blood Culture (routine x 2)     Status: None (Preliminary result)   Collection Time: 07/20/21  1:26 PM   Specimen: BLOOD  Result Value Ref Range Status   Specimen Description BLOOD  Final   Special Requests   Final    BOTTLES DRAWN AEROBIC AND ANAEROBIC Blood Culture results may not be optimal due to an inadequate volume of blood received in culture bottles   Culture   Final    NO GROWTH < 24 HOURS Performed at [facility], [place] [address], [place], [place] [address]    Report Status PENDING  Incomplete    FURTHER DISCHARGE INSTRUCTIONS:  Get Medicines reviewed and adjusted: Please take all your medications with you for your next visit with your Primary MD  Laboratory/radiological data: Please request your Primary MD to go over all hospital tests and procedure/radiological results at the follow up, please ask your Primary MD to get all Hospital records sent to his/her office.  In some cases, they will be blood work, cultures and biopsy results pending at the time of your discharge. Please request that your primary care M.D. goes through all the records of your hospital data and follows up on these results.  Also Note the following: If you experience worsening of your admission symptoms, develop shortness of breath, life threatening emergency, suicidal or homicidal thoughts you must seek medical attention immediately by calling 911 or calling your MD immediately  if symptoms less severe.  You must read complete instructions/literature along with all the possible adverse reactions/side effects for all the Medicines you take and that  have been prescribed to you. Take any new Medicines after you have completely understood and accpet all the possible adverse reactions/side effects.   Do not drive when taking Pain medications or sleeping medications (Benzodaizepines)  Do not take more than prescribed Pain, Sleep and Anxiety Medications. It is not advisable to combine anxiety,sleep and pain medications without talking with your primary care practitioner  Special Instructions: If you have smoked or chewed Tobacco  in the last 2 yrs please stop smoking, stop any regular Alcohol  and or any Recreational drug use.  Wear Seat belts while driving.  Please note: You were cared for by a hospitalist during your hospital stay. Once you are discharged, your primary care physician will handle any further medical issues. Please note that NO REFILLS for any discharge medications will be authorized once you are discharged, as it is imperative that you return to your primary care physician (or establish a relationship with a primary care physician if you do not have one) for your post hospital discharge needs so that they can reassess your need for medications and monitor your lab values.  Total Time spent coordinating discharge including counseling, education and face to face time equals  45 minutes.  SignedOren Binet 07/22/2021 11:05 AM

## 2021-07-22 NOTE — Discharge Instructions (Signed)
*  start Omnicef on 07/23/2021. Continue for the full 4 days, please*

## 2021-07-23 LAB — URINE CULTURE: Culture: >=100000 — AB

## 2021-07-25 LAB — CULTURE, BLOOD (ROUTINE X 2)
Culture: NO GROWTH
Culture: NO GROWTH
Special Requests: ADEQUATE

## 2022-04-16 IMAGING — US US RENAL
1 series · 14 of 25 positions shown · non-contrast
Comparison: CT Abdomen and Pelvis 09/06/2008.

CLINICAL DATA: 35-year-old female with flank and pelvic pain.

EXAM:
RENAL / URINARY TRACT ULTRASOUND COMPLETE

[Series 1: us renal · 14 of 34 slices shown]
[im 1/34]
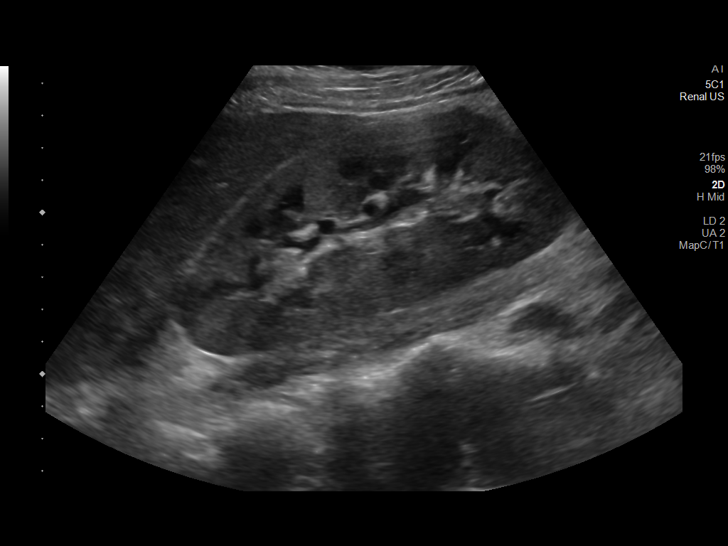
[im 3/34]
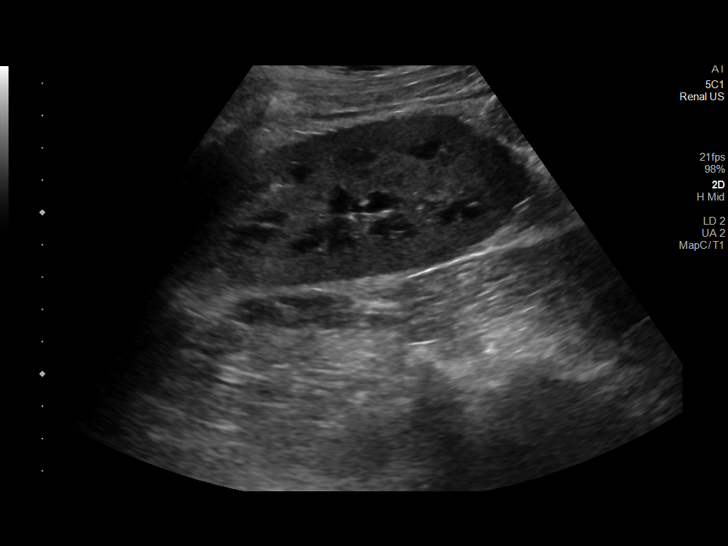
[im 6/34]
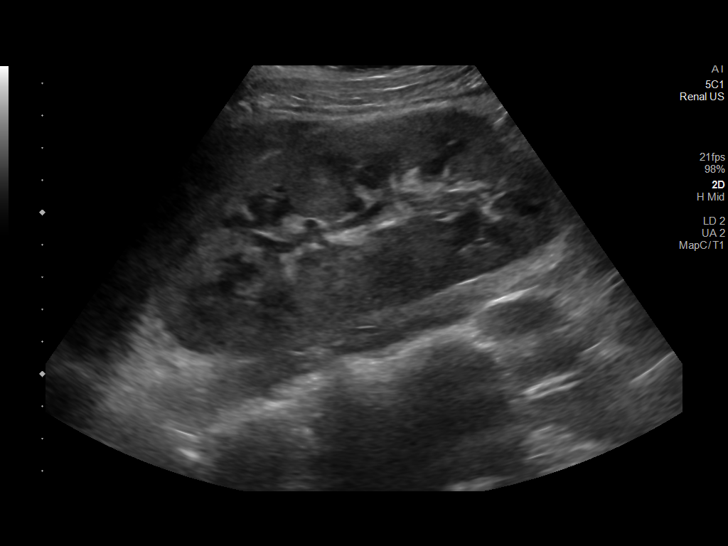
[im 9/34]
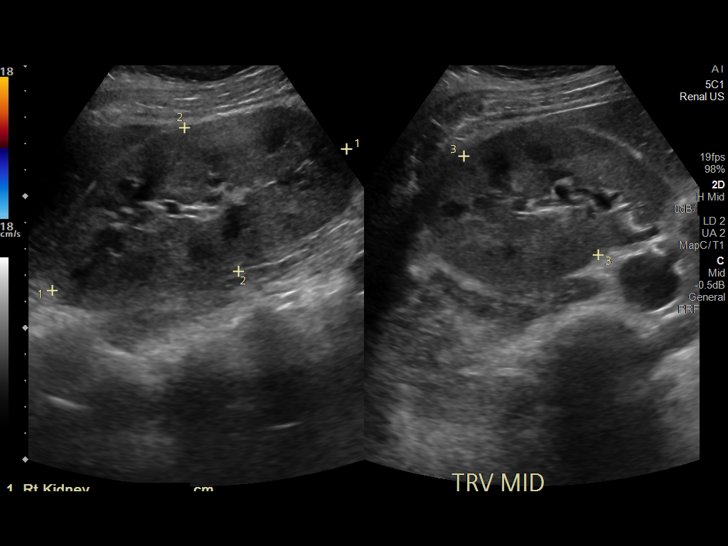
[im 12/34]
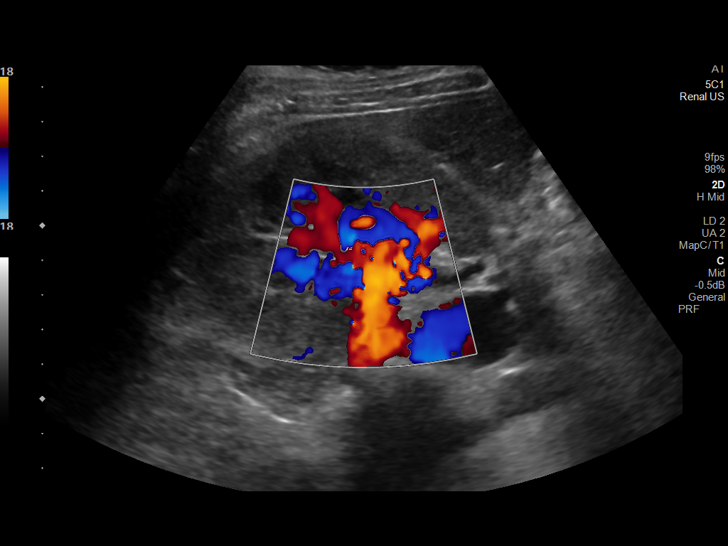
[im 13/34]
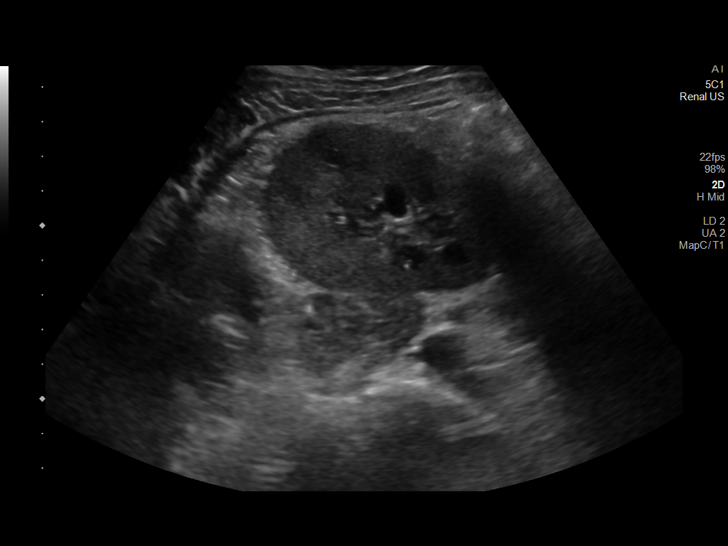
[im 16/34]
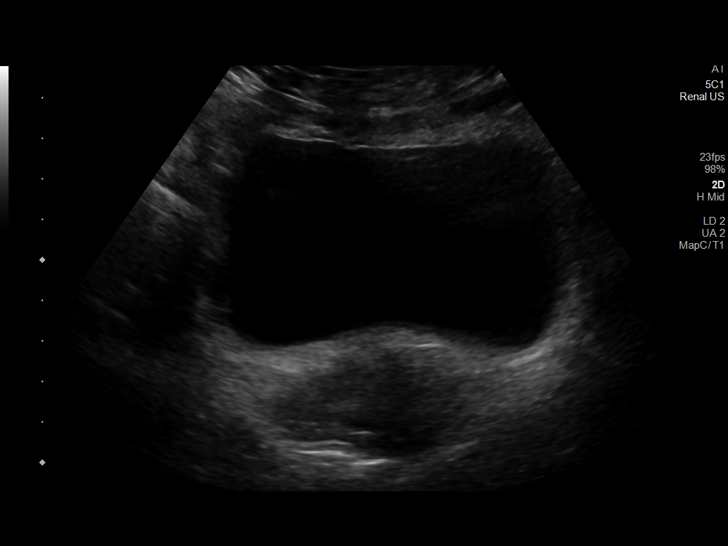
[im 18/34]
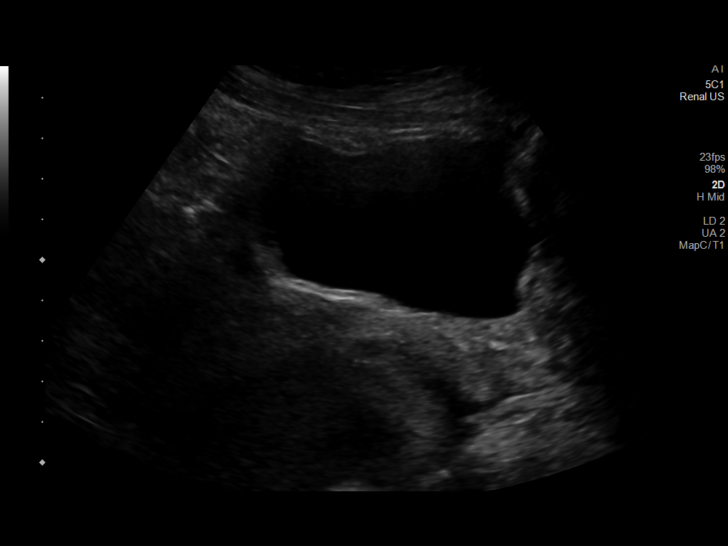
[im 21/34]
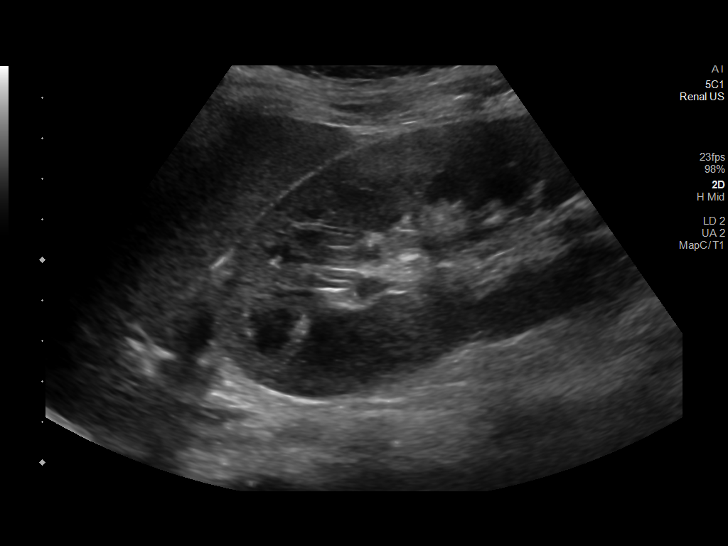
[im 23/34]
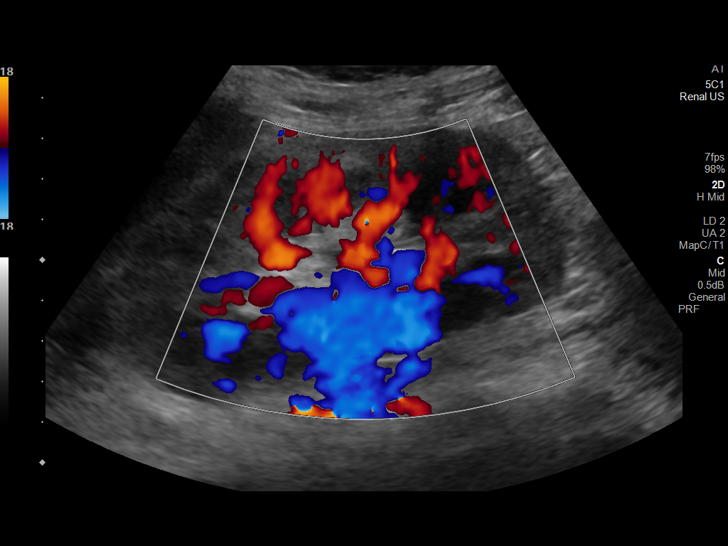
[im 25/34]
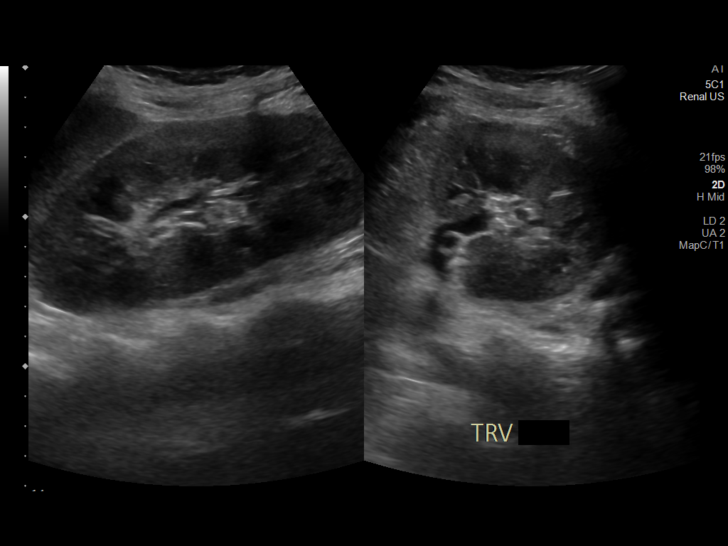
[im 28/34]
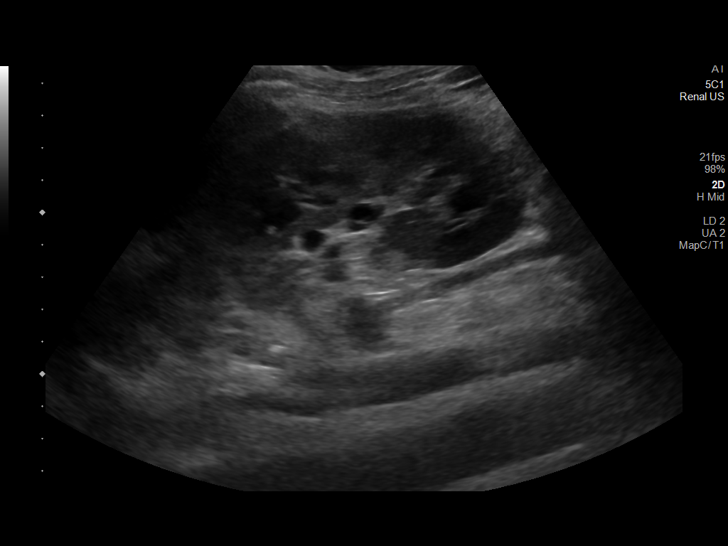
[im 31/34]
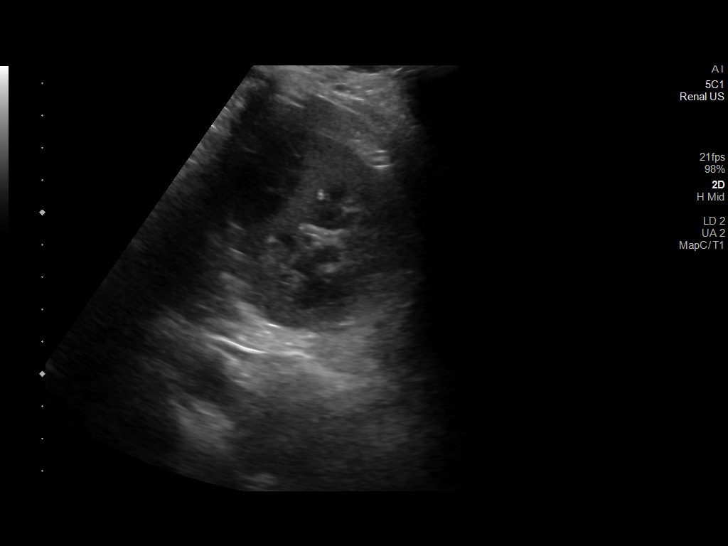
[im 34/34]
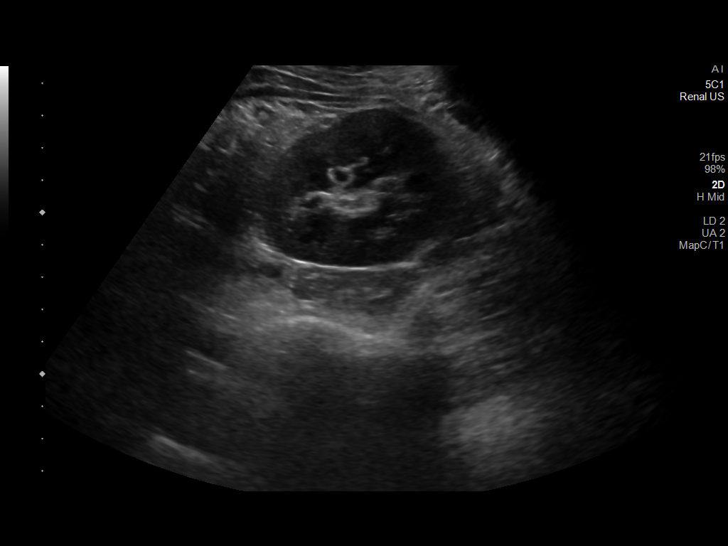

[14 of 25 positions shown; findings below may reference images not displayed]

FINDINGS: Right Kidney:

Renal measurements: 12.4 x 5.8 x 6.3 cm = volume: 240 mL. Cortical
echogenicity and corticomedullary differentiation are within normal
limits. No right hydronephrosis or renal lesion.

Left Kidney:

Renal measurements: 11.8 x 5.8 x 5.8 cm = volume: 206 mL.
Echogenicity within normal limits. No mass or hydronephrosis
visualized.

Bladder:

Appears normal for degree of bladder distention. Bladder volume
estimated at 117 mL. Both ureteral jets detected with Doppler.

Other:

None.
IMPRESSION: Ultrasound appearance of both kidneys and bladder within normal
limits.

## 2022-04-16 IMAGING — US US OB < 14 WKS - US OB TV - US DOPPLER
1 series · 13 of 28 positions shown · non-contrast
Comparison: Ob ultrasound 05/18/2009. CT Abdomen and Pelvis
09/06/2008.

CLINICAL DATA: 35-year-old female with flank and pelvic pain.
Quantitative beta HCG = 5.

EXAM:
TRANSABDOMINAL AND TRANSVAGINAL ULTRASOUND OF PELVIS
DOPPLER ULTRASOUND OF OVARIES
TECHNIQUE: Both transabdominal and transvaginal ultrasound examinations of the
pelvis were performed. Transabdominal technique was performed for
global imaging of the pelvis including uterus, ovaries, adnexal
regions, and pelvic cul-de-sac.
It was necessary to proceed with endovaginal exam following the
transabdominal exam to visualize the endometrium, ovaries. Color and
duplex Doppler ultrasound was utilized to evaluate blood flow to the
ovaries.

[Series 1: us ob less than 14 weeks w/ ob transvaginal and do · 42 acquisitions, 13 frames shown]
[im 2/42]
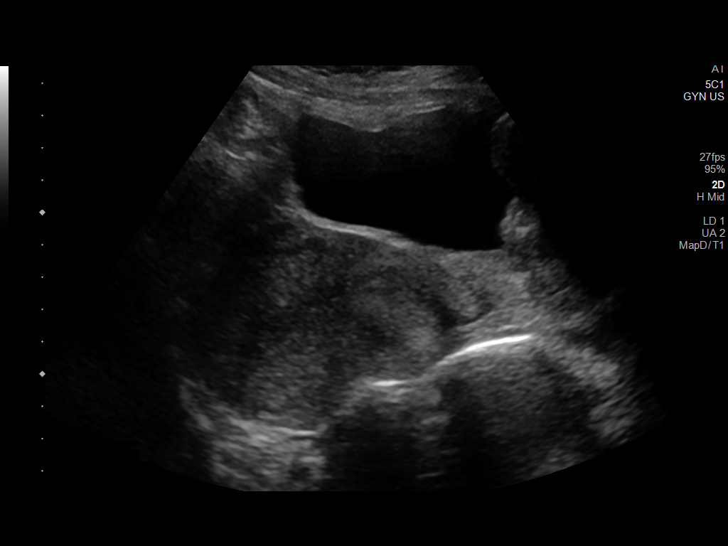
[im 5/42]
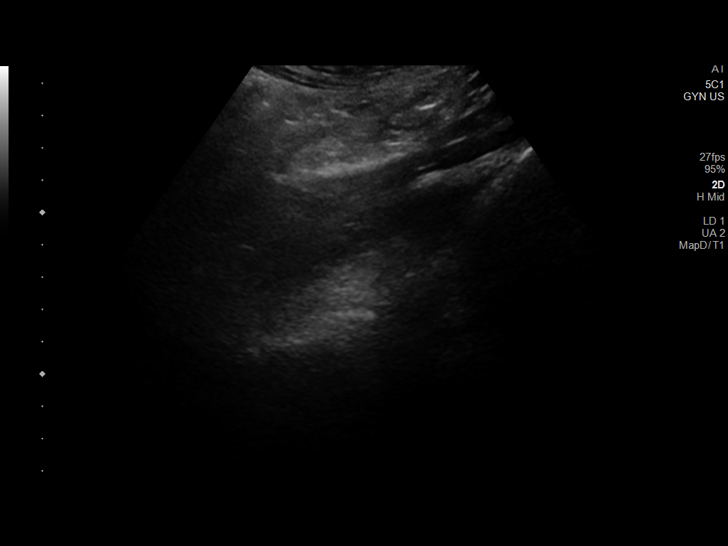
[im 8/42]
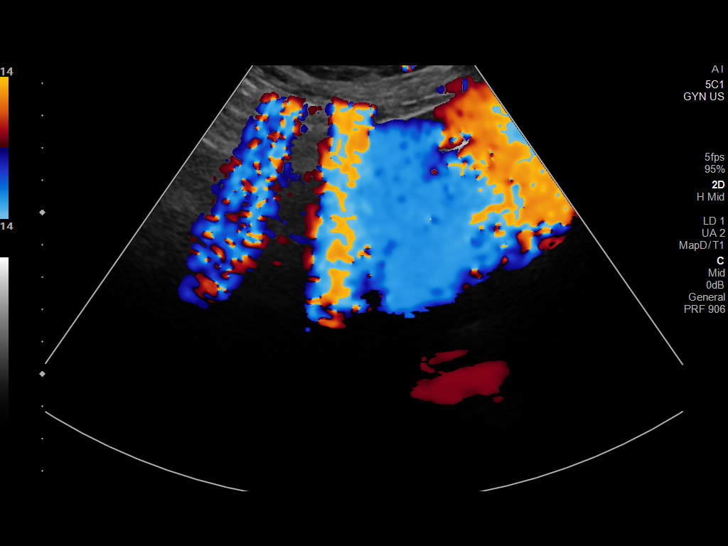
[im 11/42]
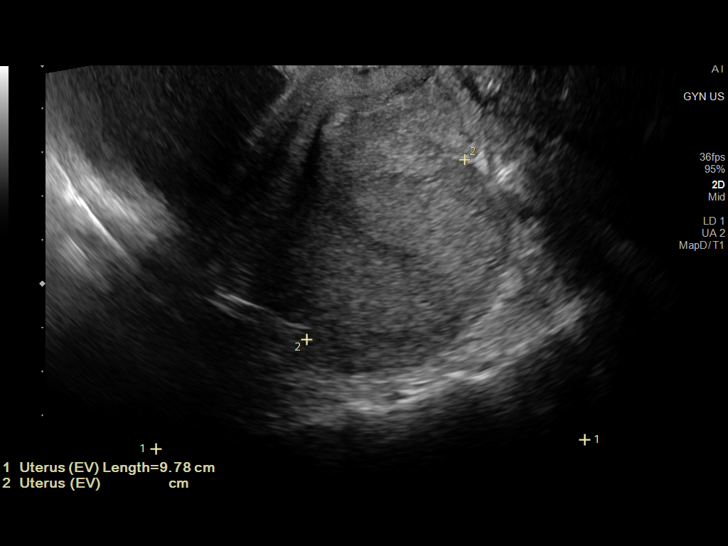
[im 14/42]
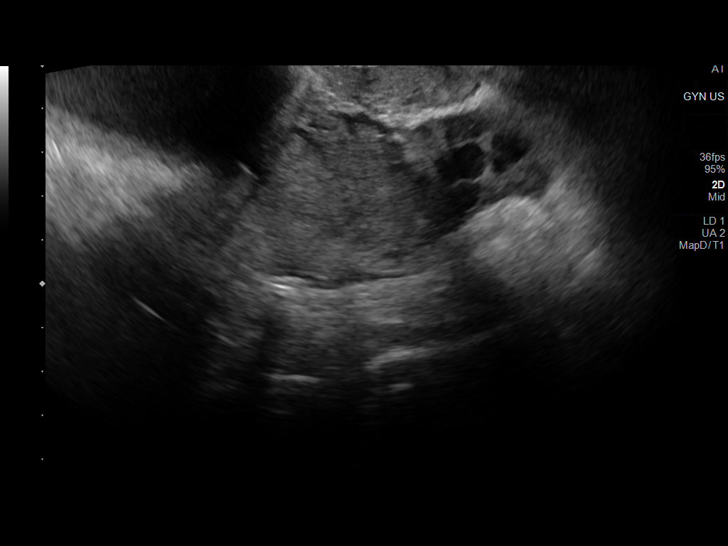
[im 17/42]
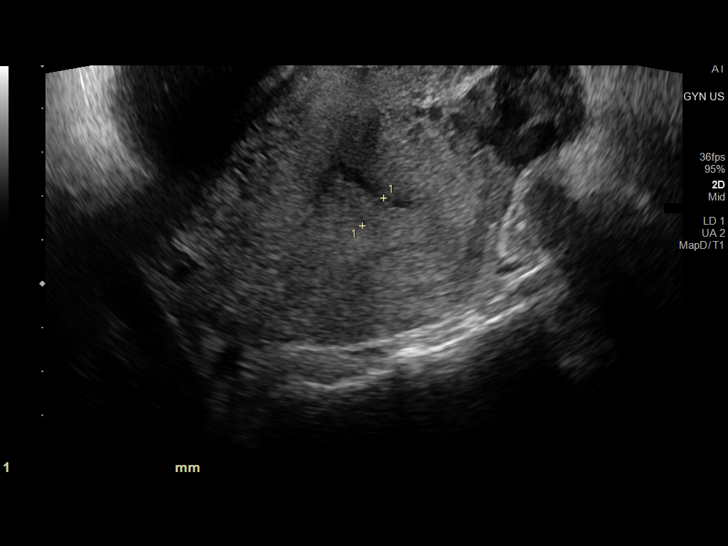
[im 22/42]
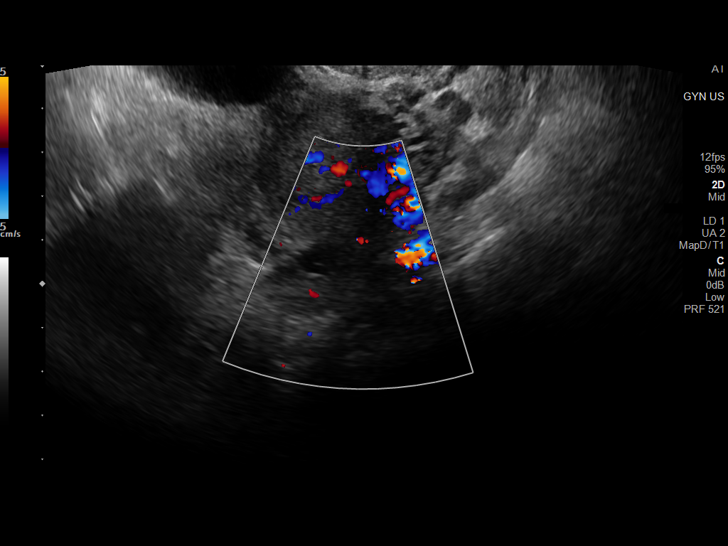
[im 25/42]
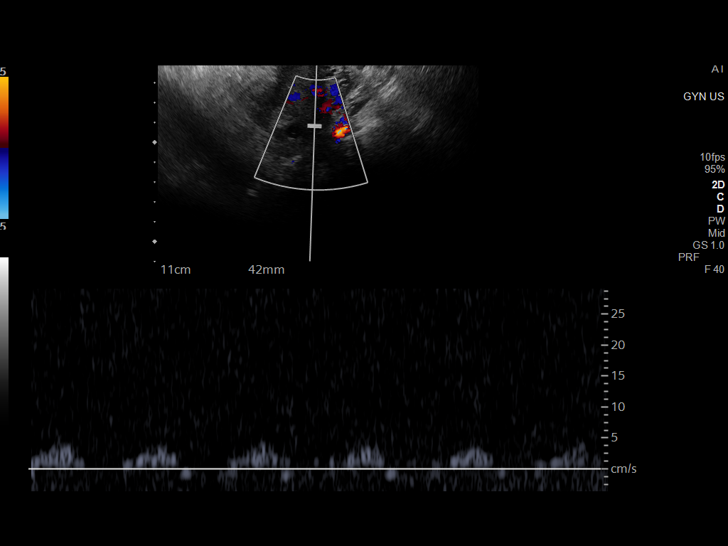
[im 28/42]
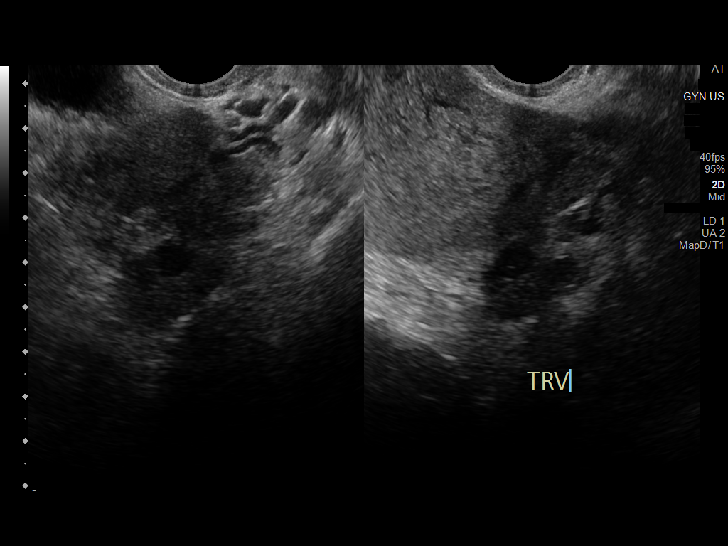
[im 31/42]
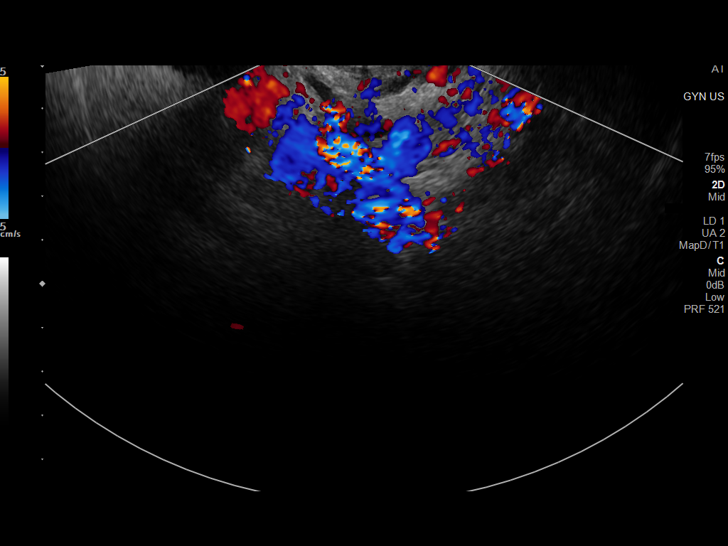
[im 34/42]
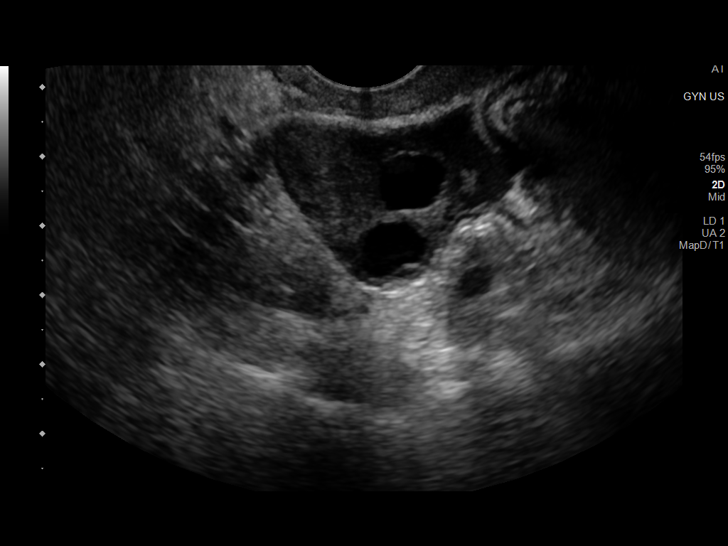
[im 37/42]
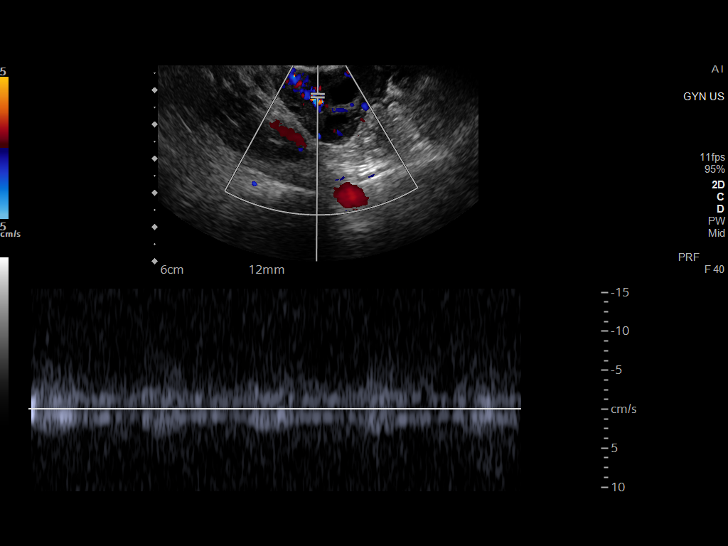
[im 40/42]
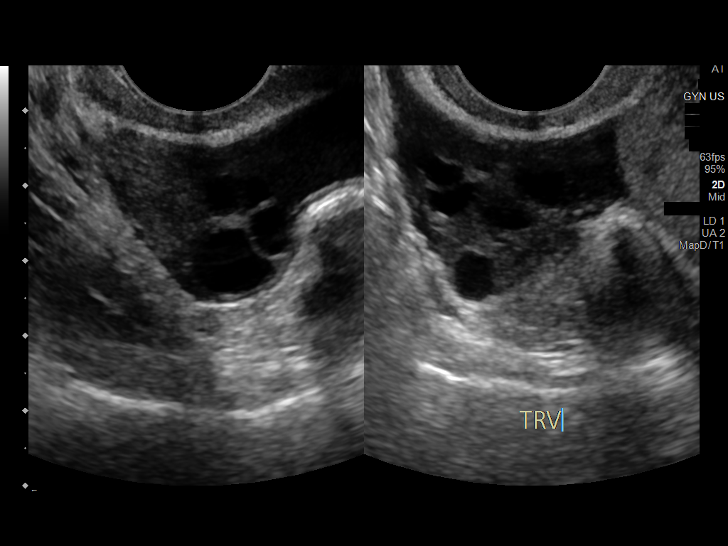

[13 of 28 positions shown; findings below may reference images not displayed]

FINDINGS: Uterus

Measurements: 9.8 x 5.6 x 6.6 cm. No fibroids or other mass
visualized.

Endometrium

Thickness: 8 mm.  No gestational sac identified.

Right ovary

Measurements: 3.1 x 1.8 x 2.2 cm = volume: 7 mL. Normal
appearance/no adnexal mass, multiple small follicles (image 35).

Left ovary

Measurements: 2.7 x 1.6 x 3.3 cm = volume: 7 mL. Normal
appearance/no adnexal mass.

Pulsed Doppler evaluation of both ovaries demonstrates normal
low-resistance arterial and venous waveforms.

Other findings

Small volume of mildly complex free fluid in the cul-de-sac.
IMPRESSION: 1. No IUP identified, and no adnexal mass. Recommend serial
quantitative beta HCG, and repeat Ultrasound as necessary.
2. No evidence of ovarian torsion. A small volume of mildly complex
free fluid in the cul-de-sac is nonspecific.

## 2022-04-16 IMAGING — CT CT ABD-PELV W/O CM
2 of 4 series · 16 of 46 positions shown, 18 images · non-contrast
Comparison: None.

CLINICAL DATA: Concern for infection

EXAM:
CT ABDOMEN AND PELVIS WITHOUT CONTRAST
TECHNIQUE: Multidetector CT imaging of the abdomen and pelvis was performed
following the standard protocol without IV contrast.

[Series 3: abd/ pelvis 5.0 i30f 2 · axial · 0.64mm/px · z∈[+823,+1243]mm · 13 of 92 slices shown, 15 images]
[im 4/92  soft-tissue]
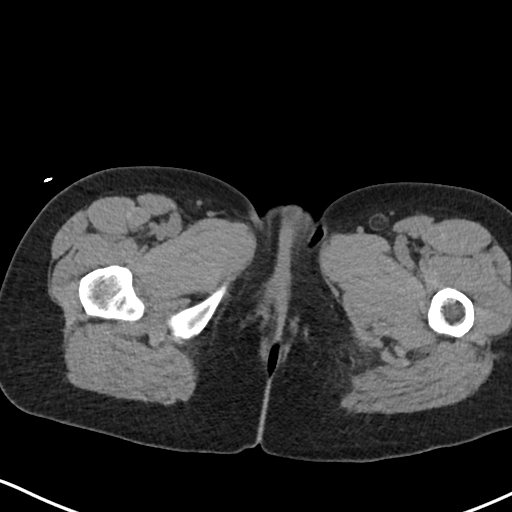
[im 4/92  bone]
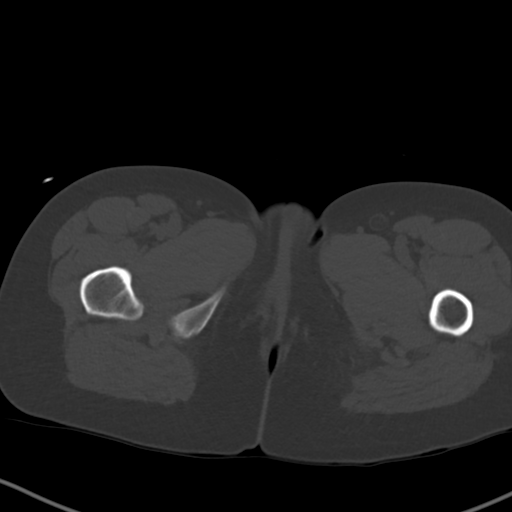
[im 11/92  soft-tissue]
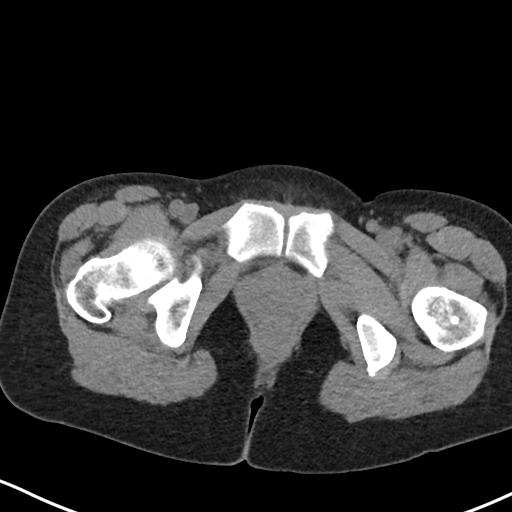
[im 18/92  soft-tissue]
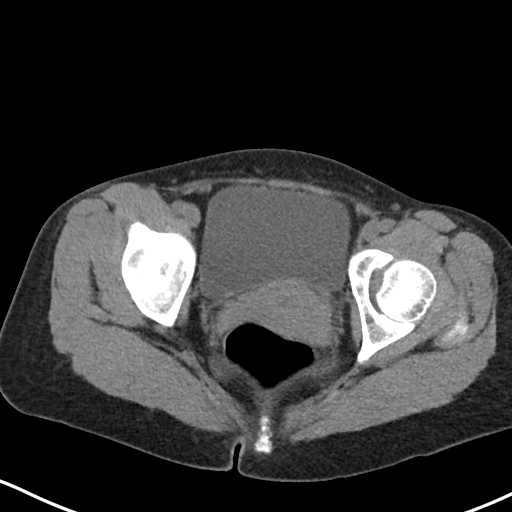
[im 25/92  soft-tissue]
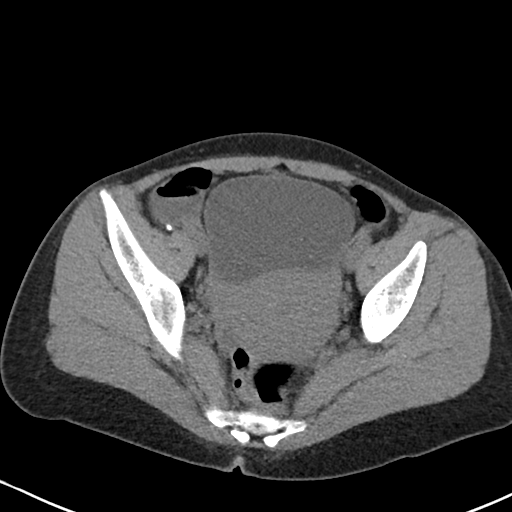
[im 32/92  soft-tissue]
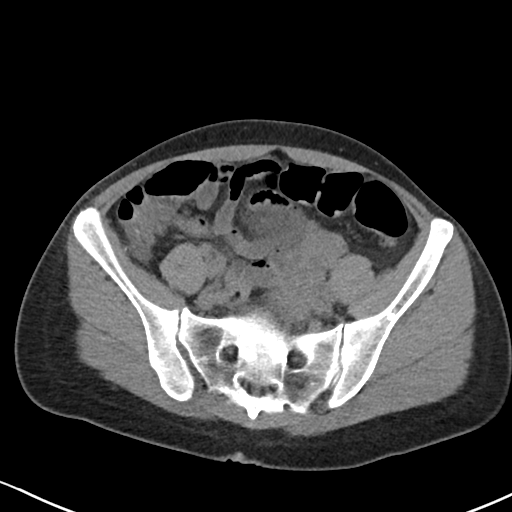
[im 39/92  soft-tissue]
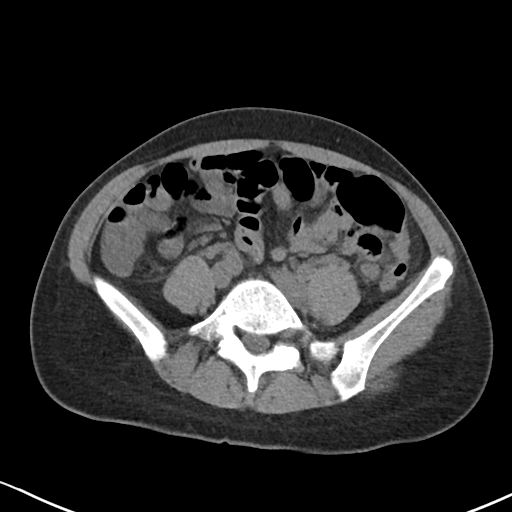
[im 46/92  soft-tissue]
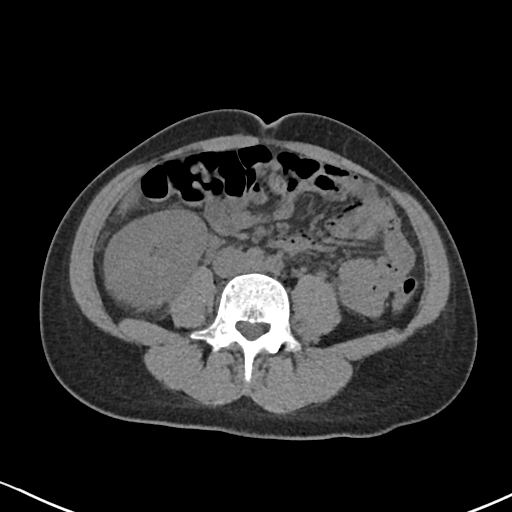
[im 53/92  soft-tissue]
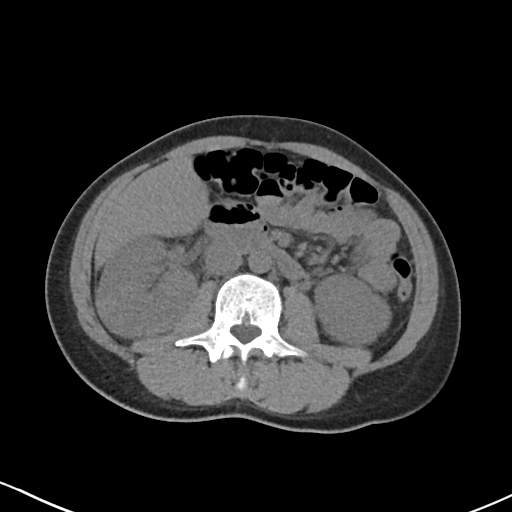
[im 60/92  soft-tissue]
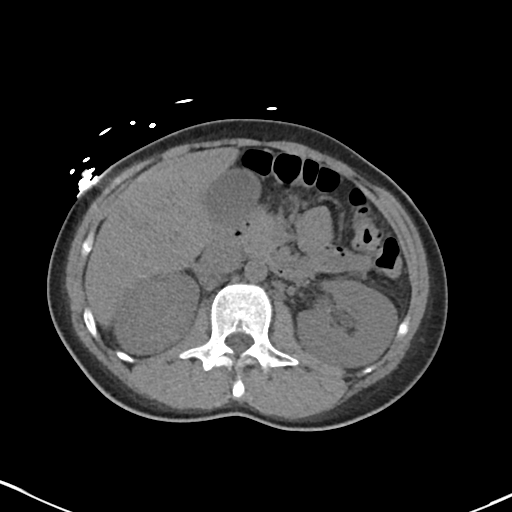
[im 60/92  bone]
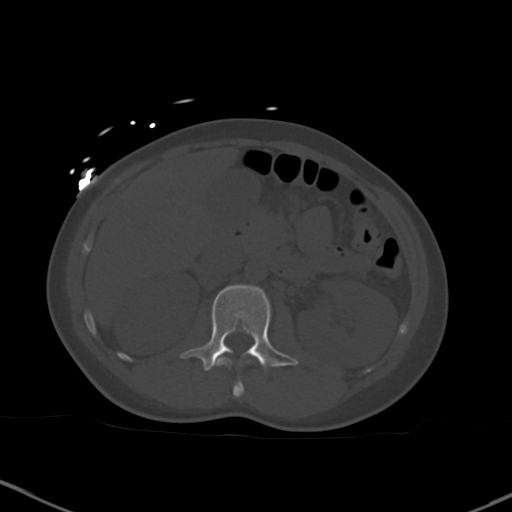
[im 67/92  soft-tissue]
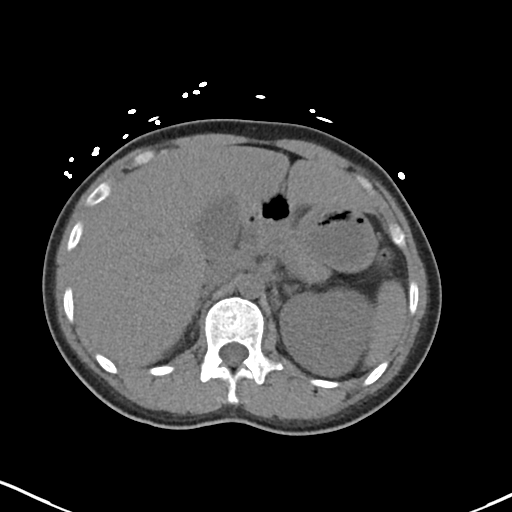
[im 74/92  soft-tissue]
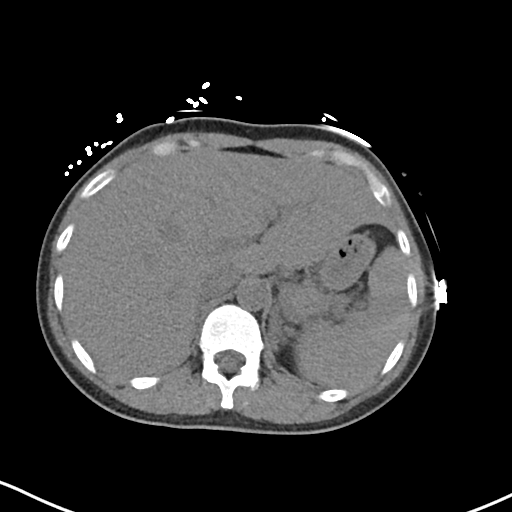
[im 81/92  soft-tissue]
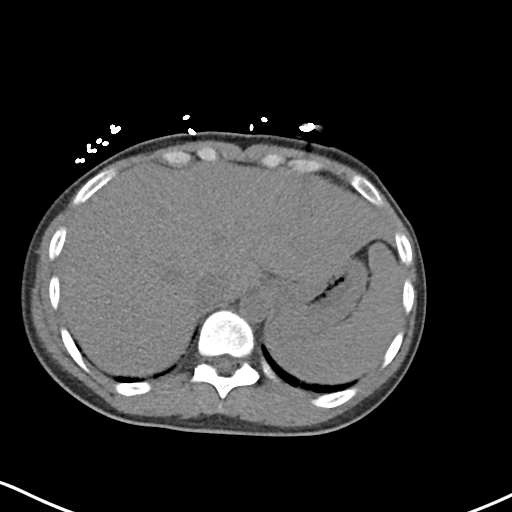
[im 88/92  soft-tissue]
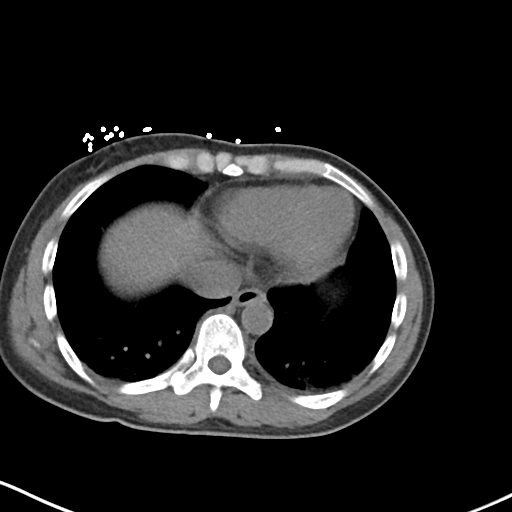

[Series 6: cor st · coronal · 0.61mm/px · 3 of 79 slices shown]
[im 27/79  soft-tissue]
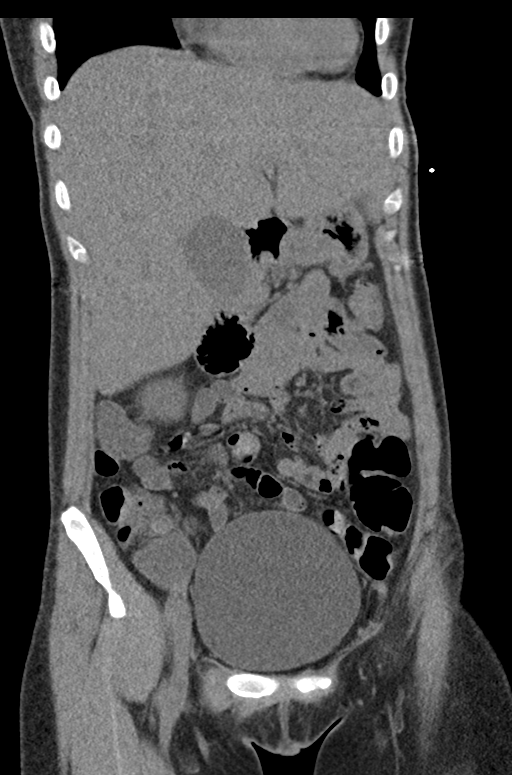
[im 35/79  soft-tissue]
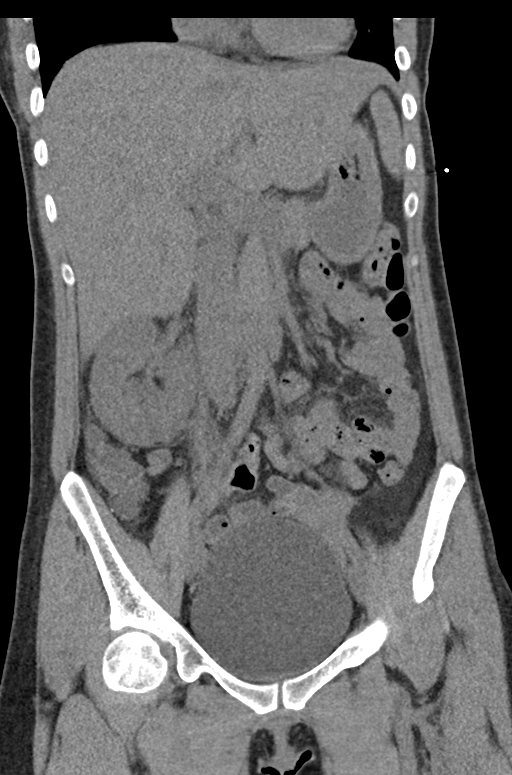
[im 44/79  soft-tissue]
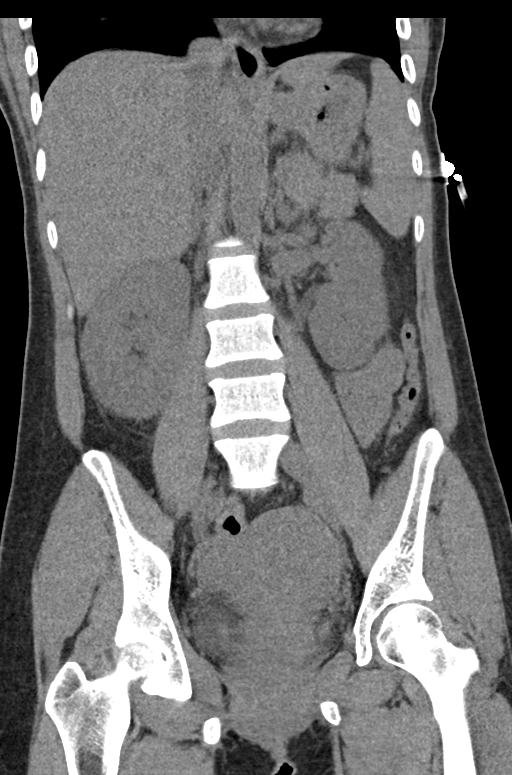

[16 of 46 positions shown; findings below may reference images not displayed]

FINDINGS: Lower chest: No acute abnormality.

Hepatobiliary: No focal liver abnormality is seen. No gallstones,
gallbladder wall thickening, or biliary dilatation.

Pancreas: Unremarkable. No pancreatic ductal dilatation or
surrounding inflammatory changes.

Spleen: Normal in size without focal abnormality.

Adrenals/Urinary Tract: Bilateral adrenal glands are unremarkable.
Mild asymmetric fat stranding about the right kidney. No
hydronephrosis or nephrolithiasis. Bladder is unremarkable.

Stomach/Bowel: Stomach is within normal limits. Appendix is not
visualized. No evidence of bowel wall thickening, distention, or
inflammatory changes.

Vascular/Lymphatic: No significant vascular findings are present. No
enlarged abdominal or pelvic lymph nodes.

Reproductive: Uterus and bilateral adnexa are unremarkable.

Other: No abdominal wall hernia or abnormality. No abdominopelvic
ascites.

Musculoskeletal: No acute or significant osseous findings.
IMPRESSION: Mild asymmetric fat stranding about the right kidney, concerning for
infection. Correlate with urinalysis.

## 2023-08-15 ENCOUNTER — Ambulatory Visit (HOSPITAL_BASED_OUTPATIENT_CLINIC_OR_DEPARTMENT_OTHER)
Admission: RE | Admit: 2023-08-15 | Discharge: 2023-08-15 | Disposition: A | Discharge status: Home / Self Care | Payer: BLUE CROSS/BLUE SHIELD | Source: Ambulatory Visit | Attending: Internal Medicine | Admitting: Internal Medicine

## 2023-08-15 ENCOUNTER — Encounter (HOSPITAL_BASED_OUTPATIENT_CLINIC_OR_DEPARTMENT_OTHER): Payer: Self-pay

## 2023-08-15 VITALS — BP 103/78 | HR 113 | Temp 98.1°F | Resp 20

## 2023-08-15 DIAGNOSIS — J189 Pneumonia, unspecified organism: Secondary | ICD-10-CM | POA: Diagnosis not present

## 2023-08-15 MED ORDER — PROMETHAZINE-DM 6.25-15 MG/5ML PO SYRP: 5.0000 mL | ORAL_SOLUTION | Freq: Four times a day (QID) | ORAL | 0 refills | Status: AC | PRN

## 2023-08-15 MED ORDER — AMOXICILLIN 500 MG PO CAPS: 1000.0000 mg | ORAL_CAPSULE | Freq: Two times a day (BID) | ORAL | 0 refills | Status: AC

## 2023-08-15 MED ORDER — AZITHROMYCIN 250 MG PO TABS: 250.0000 mg | ORAL_TABLET | Freq: Every day | ORAL | 0 refills | Status: AC

## 2023-08-15 NOTE — ED Provider Notes (Signed)
[facility]    CSN: [id number] Arrival date & time: 08/15/23  1351      History   Chief Complaint Chief Complaint  Patient presents with   Cough    Entered by patient    HPI Wendy Oconnell is a 37 y.o. female.   The history is provided by the patient.  Cough Associated symptoms: chest pain (hurts when she coughs), ear pain, fever (at onset of illness resolved) and shortness of breath   Associated symptoms: no headaches, no rhinorrhea, no sore throat and no wheezing   Dry cough for 2 weeks, admits soreness in her chest coughs and feeling shortness of breath with activity. Fever onset of illness for days now resolved.  Admits to fatigue and bodyaches.  Denies rhinorrhea, nasal congestion, sore throat, abdominal pain, nausea, vomiting, diarrhea.  Admits household contacts with illness.  Denies recent travel, hemoptysis.  She is not a smoker.  History reviewed. No pertinent past medical history.  Patient Active Problem List   Diagnosis Date Noted   Severe sepsis (HCC) 07/20/2021    Past Surgical History:  Procedure Laterality Date   ECTOPIC PREGNANCY SURGERY Left 2013    OB History     Gravida  4   Para  3   Term  2   Preterm  1   AB  1   Living  3      SAB      IAB      Ectopic  1   Multiple      Live Births  3            Home Medications    Prior to Admission medications   Medication Sig Start Date End Date Taking? Authorizing Provider  acetaminophen (TYLENOL) 500 MG tablet Take 2 tablets (1,000 mg total) by mouth every 8 (eight) hours as needed for moderate pain or headache. 07/22/21   Ghimire, Werner Lean, MD  ADDERALL XR 30 MG 24 hr capsule Take 30 mg by mouth daily. 07/02/21   [provider]  ferrous sulfate 325 (65 FE) MG tablet Take 1 tablet (325 mg total) by mouth 2 (two) times daily with a meal. 07/22/21   Ghimire, Werner Lean, MD  ibuprofen (ADVIL) 200 MG tablet Take 3 tablets (600 mg total) by mouth every 8 (eight)  hours as needed for headache or moderate pain. 07/22/21   Ghimire, Werner Lean, MD    Family History Family History  Problem Relation Age of Onset   Hypertension Father     Social History Social History   Tobacco Use   Smoking status: Never   Smokeless tobacco: Never  Vaping Use   Vaping status: Never Used  Substance Use Topics   Alcohol use: No   Drug use: No     Allergies   Patient has no known allergies.   Review of Systems Review of Systems  Constitutional:  Positive for fatigue and fever (at onset of illness resolved). Negative for appetite change.  HENT:  Positive for ear pain. Negative for postnasal drip, rhinorrhea, sore throat and trouble swallowing.   Respiratory:  Positive for cough and shortness of breath. Negative for wheezing.   Cardiovascular:  Positive for chest pain (hurts when she coughs).  Gastrointestinal:  Negative for abdominal pain, nausea and vomiting.  Neurological:  Negative for headaches.     Physical Exam Triage Vital Signs ED Triage Vitals  Encounter Vitals Group     BP 08/15/23 1411 103/78  Systolic BP Percentile --      Diastolic BP Percentile --      Pulse Rate 08/15/23 1411 (!) 113     Resp 08/15/23 1411 20     Temp 08/15/23 1411 98.1 F (36.7 C)     Temp Source 08/15/23 1411 Oral     SpO2 08/15/23 1411 94 %     Weight --      Height --      Head Circumference --      Peak Flow --      Pain Score 08/15/23 1412 0     Pain Loc --      Pain Education --      Exclude from Growth Chart --    No data found.  Updated Vital Signs BP 103/78 (BP Location: Right Arm)   Pulse (!) 113   Temp 98.1 F (36.7 C) (Oral)   Resp 20   LMP 08/07/2023 (Approximate)   SpO2 94%   Visual Acuity Right Eye Distance:   Left Eye Distance:   Bilateral Distance:    Right Eye Near:   Left Eye Near:    Bilateral Near:     Physical Exam Vitals and nursing note reviewed.  Constitutional:      Appearance: She is not ill-appearing.   HENT:     Head: Normocephalic and atraumatic.     Right Ear: Tympanic membrane and ear canal normal.     Left Ear: Tympanic membrane normal.     Nose: No rhinorrhea.     Mouth/Throat:     Mouth: Mucous membranes are moist.     Pharynx: Oropharynx is clear. No oropharyngeal exudate or posterior oropharyngeal erythema.  Cardiovascular:     Rate and Rhythm: Tachycardia present.     Heart sounds: Normal heart sounds.     Comments: HR 113 bpm Pulmonary:     Effort: Pulmonary effort is normal.     Breath sounds: Rhonchi (bilateral posterior bases) present.  Skin:    General: Skin is warm and dry.  Neurological:     Mental Status: She is alert and oriented to person, place, and time.      UC Treatments / Results  Labs (all labs ordered are listed, but only abnormal results are displayed) Labs Reviewed - No data to display  EKG   Radiology No results found.  Procedures Procedures (including critical care time)  Medications Ordered in UC Medications - No data to display  Initial Impression / Assessment and Plan / UC Course  I have reviewed the triage vital signs and the nursing notes.  Pertinent labs & imaging results that were available during my care of the patient were reviewed by me and considered in my medical decision making (see chart for details).    37 year old female with cough for 2 weeks, admits chest discomfort and shortness of breath, she is tachycardic on exam has rhonchi bilaterally and O2 sats 94%.  Will treat for atypical pneumonia.  Home care, warning signs and follow-up reviewed with patient Final Clinical Impressions(s) / UC Diagnoses   Final diagnoses:  None   Discharge Instructions   None    ED Prescriptions   None    PDMP not reviewed this encounter.   Meliton Rattan, [place] 08/15/23 1435

## 2023-08-15 NOTE — ED Triage Notes (Signed)
Dry cough x 1 week and feeling fatigued. States has already recovered from symptoms that daughter presents with. At home covid test negative.
